# Patient Record
Sex: Female | Born: 1935 | Race: White | Hispanic: No | Smoking: Never smoker
Health system: Southern US, Community
[De-identification: ages and names within clinical notes are randomized; demographics above are authoritative.]

## PROBLEM LIST (undated history)

## (undated) DIAGNOSIS — F0390 Unspecified dementia without behavioral disturbance: Secondary | ICD-10-CM

## (undated) DIAGNOSIS — F32A Depression, unspecified: Secondary | ICD-10-CM

## (undated) DIAGNOSIS — K219 Gastro-esophageal reflux disease without esophagitis: Secondary | ICD-10-CM

## (undated) DIAGNOSIS — F419 Anxiety disorder, unspecified: Secondary | ICD-10-CM

## (undated) DIAGNOSIS — F329 Major depressive disorder, single episode, unspecified: Secondary | ICD-10-CM

## (undated) DIAGNOSIS — I10 Essential (primary) hypertension: Secondary | ICD-10-CM

## (undated) HISTORY — PX: SHOULDER SURGERY: SHX246

---

## 1998-08-23 ENCOUNTER — Emergency Department (HOSPITAL_COMMUNITY): Admission: EM | Admit: 1998-08-23 | Discharge: 1998-08-23 | Payer: Self-pay | Admitting: Emergency Medicine

## 1998-09-30 ENCOUNTER — Emergency Department (HOSPITAL_COMMUNITY): Admission: EM | Admit: 1998-09-30 | Discharge: 1998-09-30 | Payer: Self-pay | Admitting: Emergency Medicine

## 1998-10-17 ENCOUNTER — Emergency Department (HOSPITAL_COMMUNITY): Admission: EM | Admit: 1998-10-17 | Discharge: 1998-10-17 | Payer: Self-pay | Admitting: Emergency Medicine

## 1999-12-31 ENCOUNTER — Emergency Department (HOSPITAL_COMMUNITY): Admission: EM | Admit: 1999-12-31 | Discharge: 1999-12-31 | Payer: Self-pay | Admitting: Emergency Medicine

## 1999-12-31 ENCOUNTER — Encounter: Payer: Self-pay | Admitting: Emergency Medicine

## 2003-09-16 ENCOUNTER — Ambulatory Visit (HOSPITAL_COMMUNITY): Admission: RE | Admit: 2003-09-16 | Discharge: 2003-09-16 | Payer: Self-pay | Admitting: Neurosurgery

## 2007-04-12 ENCOUNTER — Emergency Department (HOSPITAL_COMMUNITY): Admission: EM | Admit: 2007-04-12 | Discharge: 2007-04-12 | Payer: Self-pay | Admitting: Emergency Medicine

## 2008-11-07 ENCOUNTER — Emergency Department (HOSPITAL_COMMUNITY): Admission: EM | Admit: 2008-11-07 | Discharge: 2008-11-07 | Payer: Self-pay | Admitting: Emergency Medicine

## 2009-07-22 ENCOUNTER — Emergency Department (HOSPITAL_COMMUNITY): Admission: EM | Admit: 2009-07-22 | Discharge: 2009-07-22 | Payer: Self-pay | Admitting: Emergency Medicine

## 2009-10-03 ENCOUNTER — Emergency Department (HOSPITAL_COMMUNITY): Admission: EM | Admit: 2009-10-03 | Discharge: 2009-10-03 | Payer: Self-pay | Admitting: Emergency Medicine

## 2009-12-28 ENCOUNTER — Emergency Department (HOSPITAL_COMMUNITY): Admission: EM | Admit: 2009-12-28 | Discharge: 2009-12-28 | Payer: Self-pay | Admitting: Emergency Medicine

## 2010-06-08 ENCOUNTER — Emergency Department (HOSPITAL_COMMUNITY): Admission: EM | Admit: 2010-06-08 | Discharge: 2010-06-08 | Payer: Self-pay | Admitting: Emergency Medicine

## 2010-09-04 ENCOUNTER — Encounter: Payer: Self-pay | Admitting: Neurosurgery

## 2010-10-25 ENCOUNTER — Inpatient Hospital Stay (HOSPITAL_COMMUNITY)
Admission: EM | Admit: 2010-10-25 | Discharge: 2010-10-27 | DRG: 194 | Disposition: A | Discharge status: Home Health | Payer: PRIVATE HEALTH INSURANCE | Attending: Internal Medicine | Admitting: Internal Medicine

## 2010-10-25 ENCOUNTER — Emergency Department (HOSPITAL_COMMUNITY): Payer: PRIVATE HEALTH INSURANCE

## 2010-10-25 DIAGNOSIS — I10 Essential (primary) hypertension: Secondary | ICD-10-CM | POA: Diagnosis present

## 2010-10-25 DIAGNOSIS — E876 Hypokalemia: Secondary | ICD-10-CM | POA: Diagnosis not present

## 2010-10-25 DIAGNOSIS — F3289 Other specified depressive episodes: Secondary | ICD-10-CM | POA: Diagnosis present

## 2010-10-25 DIAGNOSIS — F329 Major depressive disorder, single episode, unspecified: Secondary | ICD-10-CM | POA: Diagnosis present

## 2010-10-25 DIAGNOSIS — J189 Pneumonia, unspecified organism: Principal | ICD-10-CM | POA: Diagnosis present

## 2010-10-25 DIAGNOSIS — E162 Hypoglycemia, unspecified: Secondary | ICD-10-CM | POA: Diagnosis not present

## 2010-10-25 DIAGNOSIS — I498 Other specified cardiac arrhythmias: Secondary | ICD-10-CM | POA: Diagnosis not present

## 2010-10-25 DIAGNOSIS — E870 Hyperosmolality and hypernatremia: Secondary | ICD-10-CM | POA: Diagnosis not present

## 2010-10-25 LAB — CBC
HCT: 37.3 % (ref 36.0–46.0)
Hemoglobin: 12.6 g/dL (ref 12.0–15.0)
MCHC: 33.8 g/dL (ref 30.0–36.0)
MCV: 90.5 fL (ref 78.0–100.0)
RBC: 4.12 MIL/uL (ref 3.87–5.11)
RDW: 12.6 % (ref 11.5–15.5)
WBC: 6.6 10*3/uL (ref 4.0–10.5)

## 2010-10-25 LAB — COMPREHENSIVE METABOLIC PANEL
AST: 27 U/L (ref 0–37)
Albumin: 4.4 g/dL (ref 3.5–5.2)
Alkaline Phosphatase: 80 U/L (ref 39–117)
Chloride: 104 mEq/L (ref 96–112)
GFR calc Af Amer: >60 mL/min (ref >60)
Sodium: 142 mEq/L (ref 135–145)
Total Bilirubin: 0.7 mg/dL (ref 0.3–1.2)
Total Protein: 7.2 g/dL (ref 6.0–8.3)

## 2010-10-25 LAB — URINALYSIS, ROUTINE W REFLEX MICROSCOPIC
Bilirubin Urine: NEGATIVE
Glucose, UA: NEGATIVE mg/dL
Hgb urine dipstick: NEGATIVE
Ketones, ur: 15 mg/dL — AB
Nitrite: NEGATIVE
Urobilinogen, UA: 0.2 mg/dL (ref 0.0–1.0)
pH: 6 (ref 5.0–8.0)

## 2010-10-25 LAB — DIFFERENTIAL
Basophils Absolute: 0 10*3/uL (ref 0.0–0.1)
Basophils Relative: 1 % (ref 0–1)
Neutro Abs: 3.6 10*3/uL (ref 1.7–7.7)

## 2010-10-25 LAB — PROCALCITONIN: Procalcitonin: <0.1 ng/mL

## 2010-10-26 LAB — BASIC METABOLIC PANEL
Calcium: 9.1 mg/dL (ref 8.4–10.5)
Chloride: 111 mEq/L (ref 96–112)
Creatinine, Ser: 1.15 mg/dL (ref 0.4–1.2)
Glucose, Bld: 119 mg/dL — ABNORMAL HIGH (ref 70–99)
Potassium: 3.7 mEq/L (ref 3.5–5.1)
Sodium: 146 mEq/L — ABNORMAL HIGH (ref 135–145)

## 2010-10-26 LAB — DIFFERENTIAL
Eosinophils Absolute: 0.1 10*3/uL (ref 0.0–0.7)
Eosinophils Relative: 1 % (ref 0–5)
Monocytes Absolute: 1 10*3/uL (ref 0.1–1.0)
Monocytes Relative: 10 % (ref 3–12)
Neutro Abs: 8.9 10*3/uL — ABNORMAL HIGH (ref 1.7–7.7)
Neutrophils Relative %: 85 % — ABNORMAL HIGH (ref 43–77)

## 2010-10-26 LAB — CBC
HCT: 37.7 % (ref 36.0–46.0)
Hemoglobin: 12.7 g/dL (ref 12.0–15.0)
MCHC: 33.7 g/dL (ref 30.0–36.0)
RDW: 12.7 % (ref 11.5–15.5)
WBC: 10.5 10*3/uL (ref 4.0–10.5)

## 2010-10-27 LAB — URINE CULTURE
Colony Count: NO GROWTH
Colony Count: >=100000
Culture  Setup Time: 201203130300
Culture  Setup Time: 201110260124
Culture: NO GROWTH

## 2010-10-27 LAB — URINALYSIS, ROUTINE W REFLEX MICROSCOPIC
Bilirubin Urine: NEGATIVE
Protein, ur: NEGATIVE mg/dL
Urobilinogen, UA: 0.2 mg/dL (ref 0.0–1.0)

## 2010-10-27 LAB — BASIC METABOLIC PANEL
CO2: 24 mEq/L (ref 19–32)
Chloride: 112 mEq/L (ref 96–112)
Creatinine, Ser: 1.02 mg/dL (ref 0.4–1.2)
GFR calc Af Amer: >60 mL/min (ref >60)
Potassium: 3.3 mEq/L — ABNORMAL LOW (ref 3.5–5.1)
Sodium: 143 mEq/L (ref 135–145)

## 2010-10-27 LAB — COMPREHENSIVE METABOLIC PANEL
ALT: 18 U/L (ref 0–35)
AST: 24 U/L (ref 0–37)
Alkaline Phosphatase: 70 U/L (ref 39–117)
Calcium: 9.4 mg/dL (ref 8.4–10.5)
Chloride: 106 mEq/L (ref 96–112)
Creatinine, Ser: 1.21 mg/dL — ABNORMAL HIGH (ref 0.4–1.2)
GFR calc Af Amer: 51 mL/min — ABNORMAL LOW (ref >60)
Sodium: 144 mEq/L (ref 135–145)
Total Bilirubin: 0.3 mg/dL (ref 0.3–1.2)

## 2010-10-27 LAB — MAGNESIUM: Magnesium: 1.6 mg/dL (ref 1.5–2.5)

## 2010-10-27 LAB — DIFFERENTIAL
Basophils Absolute: 0.1 10*3/uL (ref 0.0–0.1)
Basophils Relative: 1 % (ref 0–1)
Eosinophils Relative: 2 % (ref 0–5)
Monocytes Absolute: 0.5 10*3/uL (ref 0.1–1.0)
Neutrophils Relative %: 64 % (ref 43–77)

## 2010-10-27 LAB — CBC
Hemoglobin: 12.2 g/dL (ref 12.0–15.0)
Platelets: 266 10*3/uL (ref 150–400)
RBC: 3.91 MIL/uL (ref 3.87–5.11)
RDW: 13.2 % (ref 11.5–15.5)

## 2010-10-27 LAB — GLUCOSE, CAPILLARY: Glucose-Capillary: 58 mg/dL — ABNORMAL LOW (ref 70–99)

## 2010-10-27 LAB — URINE MICROSCOPIC-ADD ON

## 2010-10-30 LAB — CULTURE, BLOOD (ROUTINE X 2): Culture: NO GROWTH

## 2010-11-01 NOTE — Discharge Summary (Signed)
[name], [name]                ACCOUNT NO.:  [id number]  MEDICAL RECORD NO.:  [id number]           PATIENT TYPE:  I  LOCATION:  A317                          FACILITY:  [facility]  PHYSICIAN:  Elliot Cousin, M.D.    DATE OF BIRTH:  1922-05-09  DATE OF ADMISSION:  10/25/2010 DATE OF DISCHARGE:  03/14/2012LH                              DISCHARGE SUMMARY   DISCHARGE DIAGNOSES: 1. Community-acquired pneumonia. 2. Low normal blood pressures in a patient historically with     hypertension. 3. Cough, secondary to pneumonia. 4. Transient hypernatremia, resolved. 5. Transient hypoglycemia, resolved. 6. Hypokalemia. 7. Depression which remained stable. 8. Bradycardia.  TSH and free T4 were pending at the time of hospital     discharge.  DISCHARGE MEDICATIONS: 1. Avelox 400 mg daily for three more days. 2. Tessalon Perles 1 tablet 100 mg 3 times daily as needed for cough. 3. Guaifenesin LA (Mucinex) 600 mg twice daily for three more days. 4. Citalopram 10 mg 1 tablet daily. 5. Pepto-Bismol 1 teaspoon every 6 hours as needed for indigestion. 6. Tramadol 50 mg 1 tablet every 6 hours as needed for pain. 7. Stop Suprax. 8. Stop hydrocodone/homatropine. 9. Stop enalapril/HCTZ.  DISCHARGE DISPOSITION:  The patient was discharged to home in improved and stable condition on October 27, 2010.  She was advised to follow up with her primary care physician, Dr. Joette Catching in 1 week.  We will try to make an appointment for the patient before she leaves today.  CONSULTATIONS:  None.  PROCEDURE PERFORMED:  Chest x-ray on October 25, 2010.  The results revealed lower lobe density seen only on the lateral view which may represent effusion or infiltrate.  HISTORY OF PRESENT ILLNESS:  The patient is an 77 year old woman with a past medical history significant for hypertension, gastroesophageal reflux disease, and status post cholecystectomy.  She presented to the emergency department on October 25, 2010, with a chief complaint of a persistent cough.  The patient had been treated by her primary care physician a week ago for presumed pneumonia with Suprax.  However, during the course of treatment, she continued to have coughing, subjective fevers, and subjective chills.  In the emergency department, she was noted to be afebrile and hemodynamically stable.  Her chest x- ray on the lateral view revealed a lower lobe density which may have represented effusion or infiltrate.  Otherwise, there were findings of COPD with hyperinflation of the lungs.  There was apical scarring noted bilaterally.  Her white blood cell count was within normal limits at 6.6.  She was admitted for further evaluation and management.  HOSPITAL COURSE:  The patient was started empirically on Avelox and vancomycin for presumed failed outpatient treatment for community- acquired pneumonia.  Tessalon Perles were ordered as needed for cough. She was started on Mucinex b.i.d. as well.  Her blood pressures were on the low normal side.  For this reason, Vasotec/HCTZ was discontinued. She was started on gentle IV fluid hydration.  Following the initiation of IV fluids, her serum sodium increased to 146.  The IV fluids were changed to D5 half-normal saline  with potassium chloride added.  When the hypotonic fluids were started, her serum potassium fell from 3.7- 3.3.  She was repleted with potassium chloride orally.  Her blood magnesium level was within normal limits at 1.7.  Surprisingly, her venous glucose fell to 40 on the BMET this morning.  This was surprising as dextrose was added to her IV fluids yesterday.  She has no history of type 2 or type 1 diabetes mellitus.  The hypoglycemia was thought to be a mistake.  Another venous glucose was ordered after a small amount that she had eaten.  The followup blood glucose had improved to 161.  The patient was also noted to be mildly bradycardic.  On admission, her heart  rate was 52 beats per minute.  However, throughout the hospitalization, her heart rate generally ranged from 58-80.  The patient had no complaints of chest pain.  The patient improved symptomatically.  In fact, she had no complaints of coughing at all.  She did remain hemodynamically stable, although her blood pressures were in the 90s to low 100's systolically.  Her serum sodium improved to 143.  She was given 30 mEq of potassium chloride prior to hospital discharge for a serum potassium of 3.3.  She was instructed on Ensure supplements and small snacks to avoid hypoglycemia. Her venous glucose was 161 prior to hospital discharge.  A TSH and free T4 were ordered for evaluation of bradycardia.  The results were pending at the time of hospital discharge.  Blood cultures which had been ordered had remained negative x2 days.  A urine culture which had been ordered as well remained negative.  She received 3 doses of intravenous antibiotics in the hospital.  She was discharged to home on 3 more days of Avelox.     Elliot Cousin, M.D.     DF/MEDQ  D:  10/27/2010  T:  10/27/2010  Job:  [id number]  cc:   Delaney Meigs, M.D. Fax: [phone]  Electronically Signed by Elliot Cousin M.D. on 11/01/2010 09:02:07 AM

## 2010-11-03 LAB — COMPREHENSIVE METABOLIC PANEL
ALT: 17 U/L (ref 0–35)
AST: 21 U/L (ref 0–37)
CO2: 30 mEq/L (ref 19–32)
Calcium: 9 mg/dL (ref 8.4–10.5)
Chloride: 109 mEq/L (ref 96–112)
GFR calc Af Amer: >60 mL/min (ref >60)
GFR calc non Af Amer: 52 mL/min — ABNORMAL LOW (ref >60)
Sodium: 144 mEq/L (ref 135–145)

## 2010-11-03 LAB — URINE CULTURE: Colony Count: 45000

## 2010-11-03 LAB — CBC
MCHC: 33.9 g/dL (ref 30.0–36.0)
RBC: 3.9 MIL/uL (ref 3.87–5.11)
WBC: 4.9 10*3/uL (ref 4.0–10.5)

## 2010-11-03 LAB — DIFFERENTIAL
Eosinophils Absolute: 0.2 10*3/uL (ref 0.0–0.7)
Eosinophils Relative: 5 % (ref 0–5)
Lymphs Abs: 1.4 10*3/uL (ref 0.7–4.0)
Monocytes Absolute: 0.5 10*3/uL (ref 0.1–1.0)
Monocytes Relative: 10 % (ref 3–12)

## 2010-11-03 LAB — URINALYSIS, ROUTINE W REFLEX MICROSCOPIC
Bilirubin Urine: NEGATIVE
Glucose, UA: NEGATIVE mg/dL
Hgb urine dipstick: NEGATIVE
Ketones, ur: NEGATIVE mg/dL
Protein, ur: NEGATIVE mg/dL

## 2010-11-03 LAB — RAPID STREP SCREEN (MED CTR MEBANE ONLY): Streptococcus, Group A Screen (Direct): NEGATIVE
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
## 2010-11-16 LAB — BASIC METABOLIC PANEL
Chloride: 105 mEq/L (ref 96–112)
GFR calc Af Amer: >60 mL/min (ref >60)
GFR calc non Af Amer: 53 mL/min — ABNORMAL LOW (ref >60)
Potassium: 4.3 mEq/L (ref 3.5–5.1)
Sodium: 143 mEq/L (ref 135–145)

## 2010-11-16 LAB — CBC
HCT: 37.9 % (ref 36.0–46.0)
MCV: 91.4 fL (ref 78.0–100.0)
RBC: 4.15 MIL/uL (ref 3.87–5.11)
WBC: 8.3 10*3/uL (ref 4.0–10.5)

## 2010-11-16 LAB — DIFFERENTIAL
Eosinophils Absolute: 0 10*3/uL (ref 0.0–0.7)
Eosinophils Relative: 0 % (ref 0–5)
Lymphocytes Relative: 9 % — ABNORMAL LOW (ref 12–46)
Lymphs Abs: 0.8 10*3/uL (ref 0.7–4.0)
Monocytes Relative: 2 % — ABNORMAL LOW (ref 3–12)

## 2010-11-24 ENCOUNTER — Other Ambulatory Visit (HOSPITAL_COMMUNITY): Payer: Self-pay | Admitting: Family Medicine

## 2010-11-24 ENCOUNTER — Ambulatory Visit (HOSPITAL_COMMUNITY)
Admission: RE | Admit: 2010-11-24 | Discharge: 2010-11-24 | Disposition: A | Discharge status: Home / Self Care | Payer: PRIVATE HEALTH INSURANCE | Source: Ambulatory Visit | Attending: Family Medicine | Admitting: Family Medicine

## 2010-11-24 DIAGNOSIS — J159 Unspecified bacterial pneumonia: Secondary | ICD-10-CM

## 2010-11-24 DIAGNOSIS — R0602 Shortness of breath: Secondary | ICD-10-CM | POA: Insufficient documentation

## 2012-11-28 ENCOUNTER — Emergency Department (HOSPITAL_COMMUNITY)
Admission: EM | Admit: 2012-11-28 | Discharge: 2012-11-28 | Disposition: A | Discharge status: Home / Self Care | Payer: PRIVATE HEALTH INSURANCE | Attending: Emergency Medicine | Admitting: Emergency Medicine

## 2012-11-28 ENCOUNTER — Encounter (HOSPITAL_COMMUNITY): Payer: Self-pay | Admitting: *Deleted

## 2012-11-28 ENCOUNTER — Emergency Department (HOSPITAL_COMMUNITY): Payer: PRIVATE HEALTH INSURANCE

## 2012-11-28 DIAGNOSIS — K219 Gastro-esophageal reflux disease without esophagitis: Secondary | ICD-10-CM | POA: Insufficient documentation

## 2012-11-28 DIAGNOSIS — IMO0002 Reserved for concepts with insufficient information to code with codable children: Secondary | ICD-10-CM | POA: Insufficient documentation

## 2012-11-28 DIAGNOSIS — R05 Cough: Secondary | ICD-10-CM | POA: Insufficient documentation

## 2012-11-28 DIAGNOSIS — Z79899 Other long term (current) drug therapy: Secondary | ICD-10-CM | POA: Insufficient documentation

## 2012-11-28 DIAGNOSIS — I10 Essential (primary) hypertension: Secondary | ICD-10-CM | POA: Insufficient documentation

## 2012-11-28 DIAGNOSIS — R1110 Vomiting, unspecified: Secondary | ICD-10-CM | POA: Insufficient documentation

## 2012-11-28 DIAGNOSIS — F3289 Other specified depressive episodes: Secondary | ICD-10-CM | POA: Insufficient documentation

## 2012-11-28 DIAGNOSIS — R55 Syncope and collapse: Secondary | ICD-10-CM | POA: Insufficient documentation

## 2012-11-28 DIAGNOSIS — F329 Major depressive disorder, single episode, unspecified: Secondary | ICD-10-CM | POA: Insufficient documentation

## 2012-11-28 DIAGNOSIS — R197 Diarrhea, unspecified: Secondary | ICD-10-CM

## 2012-11-28 DIAGNOSIS — F411 Generalized anxiety disorder: Secondary | ICD-10-CM | POA: Insufficient documentation

## 2012-11-28 DIAGNOSIS — R109 Unspecified abdominal pain: Secondary | ICD-10-CM | POA: Insufficient documentation

## 2012-11-28 DIAGNOSIS — R059 Cough, unspecified: Secondary | ICD-10-CM | POA: Insufficient documentation

## 2012-11-28 DIAGNOSIS — R5381 Other malaise: Secondary | ICD-10-CM | POA: Insufficient documentation

## 2012-11-28 DIAGNOSIS — F0390 Unspecified dementia without behavioral disturbance: Secondary | ICD-10-CM | POA: Insufficient documentation

## 2012-11-28 DIAGNOSIS — R531 Weakness: Secondary | ICD-10-CM

## 2012-11-28 HISTORY — DX: Anxiety disorder, unspecified: F41.9

## 2012-11-28 HISTORY — DX: Major depressive disorder, single episode, unspecified: F32.9

## 2012-11-28 HISTORY — DX: Depression, unspecified: F32.A

## 2012-11-28 HISTORY — DX: Essential (primary) hypertension: I10

## 2012-11-28 HISTORY — DX: Gastro-esophageal reflux disease without esophagitis: K21.9

## 2012-11-28 HISTORY — DX: Unspecified dementia, unspecified severity, without behavioral disturbance, psychotic disturbance, mood disturbance, and anxiety: F03.90

## 2012-11-28 LAB — COMPREHENSIVE METABOLIC PANEL
BUN: 21 mg/dL (ref 6–23)
Calcium: 9.9 mg/dL (ref 8.4–10.5)
Creatinine, Ser: 1.03 mg/dL (ref 0.50–1.10)
GFR calc Af Amer: 55 mL/min — ABNORMAL LOW (ref >90)
GFR calc non Af Amer: 47 mL/min — ABNORMAL LOW (ref >90)
Glucose, Bld: 117 mg/dL — ABNORMAL HIGH (ref 70–99)
Sodium: 140 mEq/L (ref 135–145)
Total Protein: 7.6 g/dL (ref 6.0–8.3)

## 2012-11-28 LAB — CBC
HCT: 42.1 % (ref 36.0–46.0)
Hemoglobin: 14.2 g/dL (ref 12.0–15.0)
MCHC: 33.7 g/dL (ref 30.0–36.0)
MCV: 91.7 fL (ref 78.0–100.0)
RDW: 12.9 % (ref 11.5–15.5)
WBC: 12.5 10*3/uL — ABNORMAL HIGH (ref 4.0–10.5)

## 2012-11-28 LAB — TROPONIN I: Troponin I: <0.3 ng/mL (ref <0.30)

## 2012-11-28 MED ORDER — ONDANSETRON HCL 4 MG/2ML IJ SOLN
4.0000 mg | Freq: Once | INTRAMUSCULAR | Status: AC
  Administered 2012-11-28: 4 mg via INTRAVENOUS
  Filled 2012-11-28: qty 2

## 2012-11-28 MED ORDER — SODIUM CHLORIDE 0.9 % IV BOLUS (SEPSIS)
1000.0000 mL | Freq: Once | INTRAVENOUS | Status: AC
  Administered 2012-11-28: 1000 mL via INTRAVENOUS

## 2012-11-28 MED ORDER — SODIUM CHLORIDE 0.9 % IV SOLN: INTRAVENOUS | Status: DC

## 2012-11-28 NOTE — ED Provider Notes (Signed)
History     CSN: [id number]  Arrival date & time 11/28/12  0000   First MD Initiated Contact with Patient 11/28/12 0022      Chief Complaint  Patient presents with  . Diarrhea  . Emesis  . Near Syncope    (Consider location/radiation/quality/duration/timing/severity/associated sxs/prior treatment) HPI Hx per PT and family bedside. Feeling well today and this evening developed brief period of lower abdominal cramping followed by diarrhea with generalized weakness. At that time patient felt near syncopal without LOC. No fall. No nausea or vomiting but has had recent cough and bronchitis - she is taken last of her antibiotics tonight. No fevers. No blood in stools. No hemoptysis. No chest pain or shortness of breath. Symptoms moderate in severity and now feels better. No known aggravating or alleviating factors.  Past Medical History  Diagnosis Date  . Hypertension   . Anxiety   . Depression   . Dementia   . GERD (gastroesophageal reflux disease)     History reviewed. No pertinent past surgical history.  History reviewed. No pertinent family history.  History  Substance Use Topics  . Smoking status: Never Smoker   . Smokeless tobacco: Not on file  . Alcohol Use: No    OB History   Grav Para Term Preterm Abortions TAB SAB Ect Mult Living                  Review of Systems  Constitutional: Negative for fever and chills.  HENT: Negative for neck pain and neck stiffness.   Eyes: Negative for pain.  Respiratory: Positive for cough. Negative for shortness of breath.   Cardiovascular: Negative for chest pain.  Gastrointestinal: Positive for diarrhea. Negative for blood in stool and abdominal distention.  Genitourinary: Negative for dysuria.  Musculoskeletal: Negative for back pain.  Skin: Negative for rash.  Neurological: Negative for headaches.  All other systems reviewed and are negative.    Allergies  Tylenol  Home Medications   Current Outpatient Rx  Name   Route  Sig  Dispense  Refill  . citalopram (CELEXA) 10 MG tablet   Oral   Take 10 mg by mouth daily.         . enalapril (VASOTEC) 5 MG tablet   Oral   Take 5 mg by mouth daily.         . fluticasone (VERAMYST) 27.5 MCG/SPRAY nasal spray   Nasal   Place 2 sprays into the nose daily.         Marland Kitchen loratadine (CLARITIN) 10 MG tablet   Oral   Take 10 mg by mouth daily.         Marland Kitchen LORazepam (ATIVAN) 1 MG tablet   Oral   Take 1 mg by mouth every 8 (eight) hours.         . pantoprazole (PROTONIX) 40 MG tablet   Oral   Take 40 mg by mouth daily.         . traMADol (ULTRAM) 50 MG tablet   Oral   Take 50 mg by mouth every 6 (six) hours as needed for pain.           BP 123/56  Pulse 62  Temp(Src) 98.4 F (36.9 C) (Oral)  Resp 18  Ht 5\' 5"  (1.651 m)  Wt 114 lb (51.71 kg)  BMI 18.97 kg/m2  SpO2 100%  Physical Exam  Constitutional: She is oriented to person, place, and time. She appears well-developed and well-nourished.  HENT:  Head:  Normocephalic and atraumatic.  Mildly dry mucous membranes  Eyes: EOM are normal. Pupils are equal, round, and reactive to light. No scleral icterus.  Neck: Neck supple.  Cardiovascular: Normal rate, regular rhythm and intact distal pulses.   Pulmonary/Chest: Effort normal and breath sounds normal. No respiratory distress. She has no wheezes. She exhibits no tenderness.  Abdominal: Soft. Bowel sounds are normal. She exhibits no distension. There is no tenderness. There is no rebound and no guarding.  Musculoskeletal: Normal range of motion. She exhibits no edema.  Neurological: She is alert and oriented to person, place, and time.  Skin: Skin is warm and dry. No rash noted.    ED Course  Procedures (including critical care time) Results for orders placed during the hospital encounter of 11/28/12  LACTIC ACID, PLASMA      Result Value Range   Lactic Acid, Venous 3.1 (*) 0.5 - 2.2 mmol/L  CBC      Result Value Range   WBC 12.5  (*) 4.0 - 10.5 K/uL   RBC 4.59  3.87 - 5.11 MIL/uL   Hemoglobin 14.2  12.0 - 15.0 g/dL   HCT 45.4  09.8 - 11.9 %   MCV 91.7  78.0 - 100.0 fL   MCH 30.9  26.0 - 34.0 pg   MCHC 33.7  30.0 - 36.0 g/dL   RDW 14.7  82.9 - 56.2 %   Platelets 237  150 - 400 K/uL  COMPREHENSIVE METABOLIC PANEL      Result Value Range   Sodium 140  135 - 145 mEq/L   Potassium 4.5  3.5 - 5.1 mEq/L   Chloride 105  96 - 112 mEq/L   CO2 22  19 - 32 mEq/L   Glucose, Bld 117 (*) 70 - 99 mg/dL   BUN 21  6 - 23 mg/dL   Creatinine, Ser 1.30  0.50 - 1.10 mg/dL   Calcium 9.9  8.4 - 86.5 mg/dL   Total Protein 7.6  6.0 - 8.3 g/dL   Albumin 4.1  3.5 - 5.2 g/dL   AST 23  0 - 37 U/L   ALT 17  0 - 35 U/L   Alkaline Phosphatase 103  39 - 117 U/L   Total Bilirubin 0.3  0.3 - 1.2 mg/dL   GFR calc non Af Amer 47 (*) >90 mL/min   GFR calc Af Amer 55 (*) >90 mL/min  TROPONIN I      Result Value Range   Troponin I <0.30  <0.30 ng/mL   Dg Abd Acute W/chest  11/28/2012  *RADIOLOGY REPORT*  Clinical Data: Abdominal pain with nausea, vomiting, diarrhea.  ACUTE ABDOMEN SERIES (ABDOMEN 2 VIEW & CHEST 1 VIEW)  Comparison: Chest x-ray dated 11/24/2010 and acute abdominal series dated 11/07/2008  Findings: No acute abnormalities of the heart or lungs. Slight thoracolumbar scoliosis.  No free air in the abdomen.  No dilated loops of large or small bowel.  No worrisome abdominal calcifications.  IMPRESSION: No acute abnormality of the abdomen or chest.   Original Report Authenticated By: Francene Boyers, M.D.       Date: 11/28/2012  Rate: 63  Rhythm: normal sinus rhythm  QRS Axis: normal  Intervals: normal  ST/T Wave abnormalities: nonspecific ST changes  Conduction Disutrbances:none  Narrative Interpretation:   Old EKG Reviewed: none available  IV fluids. Zofran  2:30 AM recheck - patient feels much better and is requesting to be discharged home. Lactic acid is mildly elevated. Orthostatics checked and are within  normal limits.  She ambulates no acute distress. No ABD pain or cramping in the emergency department. Repeat abdominal exam remains benign. Plan discharge home with dehydration precautions and followup primary care physician for recheck in the clinic. Patient and her family agreed to strict return precautions for abdominal pain, near-syncope or any concerning symptoms  MDM  Diarrhea and dehydration presenting with generalized weakness, improved with IV fluids. Recent bronchitis with those symptoms resolved  EKG. Imaging. Labs.   Vital signs and nursing notes reviewed. Serial examinations. Stable for discharge home and outpatient follow        Sunnie Nielsen, MD 11/28/12 [phone]

## 2012-11-28 NOTE — ED Notes (Signed)
Pt had 1 episode of diarrhea, emesis, and now co weakness, had near syncopal episode at home.

## 2013-07-13 ENCOUNTER — Emergency Department (HOSPITAL_COMMUNITY): Payer: PRIVATE HEALTH INSURANCE

## 2013-07-13 ENCOUNTER — Emergency Department (HOSPITAL_COMMUNITY)
Admission: EM | Admit: 2013-07-13 | Discharge: 2013-07-13 | Disposition: A | Discharge status: Home / Self Care | Payer: PRIVATE HEALTH INSURANCE | Attending: Emergency Medicine | Admitting: Emergency Medicine

## 2013-07-13 ENCOUNTER — Encounter (HOSPITAL_COMMUNITY): Payer: Self-pay | Admitting: Emergency Medicine

## 2013-07-13 DIAGNOSIS — S43016A Anterior dislocation of unspecified humerus, initial encounter: Secondary | ICD-10-CM | POA: Insufficient documentation

## 2013-07-13 DIAGNOSIS — F411 Generalized anxiety disorder: Secondary | ICD-10-CM | POA: Insufficient documentation

## 2013-07-13 DIAGNOSIS — I10 Essential (primary) hypertension: Secondary | ICD-10-CM | POA: Insufficient documentation

## 2013-07-13 DIAGNOSIS — K219 Gastro-esophageal reflux disease without esophagitis: Secondary | ICD-10-CM | POA: Insufficient documentation

## 2013-07-13 DIAGNOSIS — F329 Major depressive disorder, single episode, unspecified: Secondary | ICD-10-CM | POA: Insufficient documentation

## 2013-07-13 DIAGNOSIS — S0003XA Contusion of scalp, initial encounter: Secondary | ICD-10-CM | POA: Insufficient documentation

## 2013-07-13 DIAGNOSIS — Z79899 Other long term (current) drug therapy: Secondary | ICD-10-CM | POA: Insufficient documentation

## 2013-07-13 DIAGNOSIS — F0390 Unspecified dementia without behavioral disturbance: Secondary | ICD-10-CM | POA: Insufficient documentation

## 2013-07-13 DIAGNOSIS — IMO0002 Reserved for concepts with insufficient information to code with codable children: Secondary | ICD-10-CM | POA: Insufficient documentation

## 2013-07-13 DIAGNOSIS — W19XXXA Unspecified fall, initial encounter: Secondary | ICD-10-CM

## 2013-07-13 DIAGNOSIS — Y939 Activity, unspecified: Secondary | ICD-10-CM | POA: Insufficient documentation

## 2013-07-13 DIAGNOSIS — Y929 Unspecified place or not applicable: Secondary | ICD-10-CM | POA: Insufficient documentation

## 2013-07-13 DIAGNOSIS — S43004A Unspecified dislocation of right shoulder joint, initial encounter: Secondary | ICD-10-CM

## 2013-07-13 DIAGNOSIS — S6990XA Unspecified injury of unspecified wrist, hand and finger(s), initial encounter: Secondary | ICD-10-CM | POA: Insufficient documentation

## 2013-07-13 DIAGNOSIS — W010XXA Fall on same level from slipping, tripping and stumbling without subsequent striking against object, initial encounter: Secondary | ICD-10-CM | POA: Insufficient documentation

## 2013-07-13 DIAGNOSIS — S59909A Unspecified injury of unspecified elbow, initial encounter: Secondary | ICD-10-CM | POA: Insufficient documentation

## 2013-07-13 DIAGNOSIS — F3289 Other specified depressive episodes: Secondary | ICD-10-CM | POA: Insufficient documentation

## 2013-07-13 MED ORDER — TRAMADOL HCL 50 MG PO TABS: 50.0000 mg | ORAL_TABLET | Freq: Four times a day (QID) | ORAL | Status: DC | PRN

## 2013-07-13 MED ORDER — TRAMADOL HCL 50 MG PO TABS
ORAL_TABLET | ORAL | Status: AC
  Filled 2013-07-13: qty 1

## 2013-07-13 MED ORDER — PROPOFOL 10 MG/ML IV BOLUS
10.0000 mg | Freq: Once | INTRAVENOUS | Status: AC
  Administered 2013-07-13: 15 mg via INTRAVENOUS
  Filled 2013-07-13: qty 1

## 2013-07-13 MED ORDER — TRAMADOL HCL 50 MG PO TABS
50.0000 mg | ORAL_TABLET | Freq: Once | ORAL | Status: AC
  Administered 2013-07-13: 50 mg via ORAL

## 2013-07-13 NOTE — ED Notes (Signed)
Per pt's daughter, "She was trying to get up and see what was in a ups box and she tripped and fell. I put heat and ice on it and she just started crying, so I knew that something was wrong. She just screams when I lifted it up to put on her clothes."

## 2013-07-13 NOTE — ED Notes (Signed)
Occasional PAC's and PVC's SR on cardiac monitor

## 2013-07-13 NOTE — ED Notes (Signed)
Vital signs stable. 

## 2013-07-13 NOTE — ED Notes (Signed)
Family updated as to patient's status.  Daughter at bedside.  

## 2013-07-13 NOTE — ED Notes (Signed)
Shoulder manipulated into place by dr pickering.

## 2013-07-13 NOTE — ED Notes (Signed)
MD at bedside. 

## 2013-07-13 NOTE — ED Notes (Signed)
Pt tripped and fell this morning, family stated that the pt has been c/o right arm pain since the fall, fall was witnessed by her family, no loc.  Pt with h/o dementia.

## 2013-07-13 NOTE — ED Notes (Signed)
Radiology at bedside to complete post reduction xray

## 2013-07-13 NOTE — ED Provider Notes (Signed)
CSN: [id number]     Arrival date & time 07/13/13  1716 History  This chart was scribed for [employer]. Rubin Payor, MD by Joaquin Music, ED Scribe. This patient was seen in room APA01/APA01 and the patient's care was started at 6:14 PM  Chief Complaint  Patient presents with  . Fall   The history is provided by a caregiver. The history is limited by the condition of the patient (Pt has dementia). No language interpreter was used.  LEVEL 5 CAVEAT-DEMENTIA  HPI Comments: [name] is a 79 y.o. female with a hx of dementia who presents to the Emergency Department complaining of fall this morning. Family member states pt has been complaining of R arm pain. Family member has noted minor scratches to pt R arm and lower extremities. Pt has been complaining of neck pain. Family member denies pt LOC. Family member states pt generally does not have a good sense of balance prior to her fall. Family member denies any other injuries.   Past Medical History  Diagnosis Date  . Hypertension   . Anxiety   . Depression   . Dementia   . GERD (gastroesophageal reflux disease)    History reviewed. No pertinent past surgical history. No family history on file. History  Substance Use Topics  . Smoking status: Never Smoker   . Smokeless tobacco: Not on file  . Alcohol Use: No   OB History   Grav Para Term Preterm Abortions TAB SAB Ect Mult Living                 Review of Systems  Unable to perform ROS: Dementia   Allergies  Tylenol  Home Medications   Current Outpatient Rx  Name  Route  Sig  Dispense  Refill  . cholecalciferol (VITAMIN D) 400 UNITS TABS tablet   Oral   Take 400 Units by mouth daily.         . citalopram (CELEXA) 10 MG tablet   Oral   Take 10 mg by mouth daily.         . enalapril (VASOTEC) 5 MG tablet   Oral   Take 5 mg by mouth daily.         . fluticasone (VERAMYST) 27.5 MCG/SPRAY nasal spray   Nasal   Place 2 sprays into the nose daily.          Marland Kitchen loratadine (CLARITIN) 10 MG tablet   Oral   Take 10 mg by mouth daily.         Marland Kitchen LORazepam (ATIVAN) 1 MG tablet   Oral   Take 1 mg by mouth 2 (two) times daily as needed for anxiety.          . pantoprazole (PROTONIX) 40 MG tablet   Oral   Take 40 mg by mouth daily.         . traMADol (ULTRAM) 50 MG tablet   Oral   Take 50 mg by mouth every 6 (six) hours as needed for pain.         . traMADol (ULTRAM) 50 MG tablet   Oral   Take 1 tablet (50 mg total) by mouth every 6 (six) hours as needed.   15 tablet   0    Triage Vitals:BP 174/84  Pulse 77  Temp(Src) 98.2 F (36.8 C) (Oral)  Resp 18  Ht 5\' 5"  (1.651 m)  Wt 105 lb (47.628 kg)  BMI 17.47 kg/m2  SpO2 96%  Physical Exam  HENT:  Ecchymotic area on Left lower jaw  Pulmonary/Chest:  Chest non-tender. L chest fullness  Musculoskeletal:  L arm atraumatic. Pain with movement of R elbow. Neurovascularly intact.   ED Course  ORTHOPEDIC INJURY TREATMENT Date/Time: 07/13/2013 9:44 PM Performed by: Benjiman Core R. Authorized by: Billee Cashing Consent: Verbal consent obtained. written consent not obtained. Risks and benefits: risks, benefits and alternatives were discussed Consent given by: patient Patient understanding: patient states understanding of the procedure being performed Patient consent: the patient's understanding of the procedure matches consent given Procedure consent: procedure consent matches procedure scheduled Relevant documents: relevant documents present and verified Test results: test results available and properly labeled Site marked: the operative site was marked Imaging studies: imaging studies available Required items: required blood products, implants, devices, and special equipment available Patient identity confirmed: verbally with patient, arm band and provided demographic data Time out: Immediately prior to procedure a "time out" was called to verify the correct  patient, procedure, equipment, support staff and site/side marked as required. Injury location: shoulder Location details: right shoulder Injury type: dislocation Dislocation type: anterior Hill-Sachs deformity: no Chronicity: new Pre-procedure neurovascular assessment: neurovascularly intact Pre-procedure distal perfusion: normal Pre-procedure neurological function: normal Pre-procedure range of motion: reduced Local anesthesia used: no Patient sedated: yes Sedation type: moderate (conscious) sedation Sedatives: propofol Vitals: Vital signs were monitored during sedation. (10 minutes of sedation) Manipulation performed: yes Reduction method: traction and counter traction Reduction successful: yes X-ray confirmed reduction: yes Immobilization: sling Post-procedure neurovascular assessment: post-procedure neurovascularly intact Post-procedure distal perfusion: normal Post-procedure neurological function: normal Post-procedure range of motion: improved Patient tolerance: Patient tolerated the procedure well with no immediate complications.    COORDINATION OF CARE: 6:21 PM-Discussed treatment plan which includes X-Rays of area. Caregiver of pt agreed to plan.   8:10 PM-Checked on pts status. Will perform R arm reduction.  8:26 PM-Performed R arm reduction. Will D/C pt in a R arm splint.  Labs Review Labs Reviewed - No data to display Imaging Review Dg Shoulder Right  07/13/2013   CLINICAL DATA:  Reduction  EXAM: RIGHT SHOULDER - 2+ VIEW  COMPARISON:  1840 hr  FINDINGS: There is now anatomic alignment of the humeral head with respect to the glenoid. Small bony densities remain adjacent to the posterior humeral head. Small avulsion fractures are not excluded.  IMPRESSION: Anatomic reduction. Tiny fracture fragments adjacent to the posterior humeral head are noted.   Electronically Signed   By: Maryclare Bean M.D.   On: 07/13/2013 21:00   Dg Shoulder Right  07/13/2013   CLINICAL  DATA:  Fall  EXAM: RIGHT SHOULDER - 2+ VIEW  COMPARISON:  None.  FINDINGS: There is complete anterior dislocation of the humeral head with respect to the glenoid. Small bony densities at the inferior glenoid likely represent osteophytes. Small fracture fragments cannot be excluded.  IMPRESSION: Anterior glenohumeral dislocation. Possible fracture fragments adjacent to the gland   Electronically Signed   By: Maryclare Bean M.D.   On: 07/13/2013 19:19   Dg Elbow 2 Views Right  07/13/2013   CLINICAL DATA:  Fall  EXAM: RIGHT ELBOW - 2 VIEW  COMPARISON:  None.  FINDINGS: The lateral view is limited. No obvious fracture or dislocation. No obvious joint effusion.  IMPRESSION: No acute bony pathology.   Electronically Signed   By: Maryclare Bean M.D.   On: 07/13/2013 19:20   Ct Head Wo Contrast  07/13/2013   CLINICAL DATA:  Fall  EXAM: CT HEAD WITHOUT CONTRAST  CT MAXILLOFACIAL WITHOUT CONTRAST  CT CERVICAL SPINE WITHOUT CONTRAST  TECHNIQUE: Multidetector CT imaging of the head, cervical spine, and maxillofacial structures were performed using the standard protocol without intravenous contrast. Multiplanar CT image reconstructions of the cervical spine and maxillofacial structures were also generated.  COMPARISON:  12/28/2009  FINDINGS: CT HEAD FINDINGS  Chronic ischemic changes in the periventricular white matter. Global atrophy. No mass effect, midline shift, or acute intracranial hemorrhage. Mastoid air cells and visualized paranasal sinuses are clear. Cranium is intact.  CT MAXILLOFACIAL FINDINGS  Postoperative changes in the right maxillary sinus and right maxilla are stable. No breakage or loosening of the hardware. No acute fracture or dislocation. Periodontal disease involving a right lower bicuspid is associated with a lytic lesion in the mandible. Upper alveolar rate she is edentulous. No mandible fracture. Globes are intact.  CT CERVICAL SPINE FINDINGS  No acute fracture. No dislocation. Advanced spondylitic and  degenerative changes throughout the cervical spine are noted. Fibrotic changes at the lung apices have a chronic appearance. An element of spinal stenosis is suspected at C6-7 and C3-4. Foraminal narrowing occurs at C5-6 and C6-7 on the left secondary to uncovertebral osteophytes. No obvious soft tissue injury. No spinal hematoma. Thyroid is atrophic.  IMPRESSION: No acute intracranial pathology.  No acute facial bone injury.  No evidence of cervical spine injury.   Electronically Signed   By: Maryclare Bean M.D.   On: 07/13/2013 19:18   Ct Cervical Spine Wo Contrast  07/13/2013   CLINICAL DATA:  Fall  EXAM: CT HEAD WITHOUT CONTRAST  CT MAXILLOFACIAL WITHOUT CONTRAST  CT CERVICAL SPINE WITHOUT CONTRAST  TECHNIQUE: Multidetector CT imaging of the head, cervical spine, and maxillofacial structures were performed using the standard protocol without intravenous contrast. Multiplanar CT image reconstructions of the cervical spine and maxillofacial structures were also generated.  COMPARISON:  12/28/2009  FINDINGS: CT HEAD FINDINGS  Chronic ischemic changes in the periventricular white matter. Global atrophy. No mass effect, midline shift, or acute intracranial hemorrhage. Mastoid air cells and visualized paranasal sinuses are clear. Cranium is intact.  CT MAXILLOFACIAL FINDINGS  Postoperative changes in the right maxillary sinus and right maxilla are stable. No breakage or loosening of the hardware. No acute fracture or dislocation. Periodontal disease involving a right lower bicuspid is associated with a lytic lesion in the mandible. Upper alveolar rate she is edentulous. No mandible fracture. Globes are intact.  CT CERVICAL SPINE FINDINGS  No acute fracture. No dislocation. Advanced spondylitic and degenerative changes throughout the cervical spine are noted. Fibrotic changes at the lung apices have a chronic appearance. An element of spinal stenosis is suspected at C6-7 and C3-4. Foraminal narrowing occurs at C5-6 and  C6-7 on the left secondary to uncovertebral osteophytes. No obvious soft tissue injury. No spinal hematoma. Thyroid is atrophic.  IMPRESSION: No acute intracranial pathology.  No acute facial bone injury.  No evidence of cervical spine injury.   Electronically Signed   By: Maryclare Bean M.D.   On: 07/13/2013 19:18   Ct Maxillofacial Wo Cm  07/13/2013   CLINICAL DATA:  Fall  EXAM: CT HEAD WITHOUT CONTRAST  CT MAXILLOFACIAL WITHOUT CONTRAST  CT CERVICAL SPINE WITHOUT CONTRAST  TECHNIQUE: Multidetector CT imaging of the head, cervical spine, and maxillofacial structures were performed using the standard protocol without intravenous contrast. Multiplanar CT image reconstructions of the cervical spine and maxillofacial structures were also generated.  COMPARISON:  12/28/2009  FINDINGS: CT HEAD FINDINGS  Chronic ischemic changes in the  periventricular white matter. Global atrophy. No mass effect, midline shift, or acute intracranial hemorrhage. Mastoid air cells and visualized paranasal sinuses are clear. Cranium is intact.  CT MAXILLOFACIAL FINDINGS  Postoperative changes in the right maxillary sinus and right maxilla are stable. No breakage or loosening of the hardware. No acute fracture or dislocation. Periodontal disease involving a right lower bicuspid is associated with a lytic lesion in the mandible. Upper alveolar rate she is edentulous. No mandible fracture. Globes are intact.  CT CERVICAL SPINE FINDINGS  No acute fracture. No dislocation. Advanced spondylitic and degenerative changes throughout the cervical spine are noted. Fibrotic changes at the lung apices have a chronic appearance. An element of spinal stenosis is suspected at C6-7 and C3-4. Foraminal narrowing occurs at C5-6 and C6-7 on the left secondary to uncovertebral osteophytes. No obvious soft tissue injury. No spinal hematoma. Thyroid is atrophic.  IMPRESSION: No acute intracranial pathology.  No acute facial bone injury.  No evidence of cervical  spine injury.   Electronically Signed   By: Maryclare Bean M.D.   On: 07/13/2013 19:18    EKG Interpretation   None       MDM   1. Fall, initial encounter   2. Shoulder dislocation, right, initial encounter    Patient with fall. Did hit chin. Negative head, maxillofacial, and cervical spine CT. Right shoulder dislocation. Does have small fracture adjacent to glenoid. Reduced under conscious sedation. Will followup with orthopedic surgery. Family requested Dr. Hilda Lias  I personally performed the services described in this documentation, which was scribed in my presence. The recorded information has been reviewed and is accurate.     Juliet Rude. Rubin Payor, MD 07/13/13 [phone]

## 2013-07-13 NOTE — ED Notes (Signed)
Pt remains alert. 5mg  Propofol IVP

## 2013-07-13 NOTE — ED Notes (Signed)
Pt's mentation remains at baseline. Pt states "I'm comfortable." Daughter at bedside

## 2013-07-13 NOTE — ED Notes (Signed)
Cardiac monitor showing NSR

## 2013-07-13 NOTE — ED Notes (Signed)
Pt to baseline. Oriented to self. No complaints of pain, face relaxed

## 2013-07-13 NOTE — ED Notes (Signed)
Pt remains alert, groans with movement of right arm. 5mg  propofol IVP.

## 2013-09-16 ENCOUNTER — Ambulatory Visit: Payer: PRIVATE HEALTH INSURANCE | Attending: Orthopaedic Surgery | Admitting: Physical Therapy

## 2013-09-16 DIAGNOSIS — M545 Low back pain, unspecified: Secondary | ICD-10-CM | POA: Insufficient documentation

## 2013-09-16 DIAGNOSIS — IMO0001 Reserved for inherently not codable concepts without codable children: Secondary | ICD-10-CM | POA: Insufficient documentation

## 2013-09-16 DIAGNOSIS — M6281 Muscle weakness (generalized): Secondary | ICD-10-CM | POA: Diagnosis not present

## 2013-09-18 ENCOUNTER — Ambulatory Visit: Payer: PRIVATE HEALTH INSURANCE | Admitting: Physical Therapy

## 2013-09-18 DIAGNOSIS — IMO0001 Reserved for inherently not codable concepts without codable children: Secondary | ICD-10-CM | POA: Diagnosis not present

## 2013-09-24 ENCOUNTER — Ambulatory Visit: Payer: PRIVATE HEALTH INSURANCE | Admitting: Physical Therapy

## 2013-09-24 DIAGNOSIS — IMO0001 Reserved for inherently not codable concepts without codable children: Secondary | ICD-10-CM | POA: Diagnosis not present

## 2013-09-26 ENCOUNTER — Ambulatory Visit: Payer: PRIVATE HEALTH INSURANCE | Admitting: Physical Therapy

## 2013-09-26 DIAGNOSIS — IMO0001 Reserved for inherently not codable concepts without codable children: Secondary | ICD-10-CM | POA: Diagnosis not present

## 2013-10-01 ENCOUNTER — Encounter: Payer: PRIVATE HEALTH INSURANCE | Admitting: Physical Therapy

## 2013-10-03 ENCOUNTER — Ambulatory Visit: Payer: PRIVATE HEALTH INSURANCE | Admitting: Physical Therapy

## 2013-10-03 DIAGNOSIS — IMO0001 Reserved for inherently not codable concepts without codable children: Secondary | ICD-10-CM | POA: Diagnosis not present

## 2013-10-08 ENCOUNTER — Encounter: Payer: PRIVATE HEALTH INSURANCE | Admitting: Physical Therapy

## 2013-10-10 ENCOUNTER — Encounter: Payer: PRIVATE HEALTH INSURANCE | Admitting: Physical Therapy

## 2013-10-15 ENCOUNTER — Ambulatory Visit: Payer: PRIVATE HEALTH INSURANCE | Attending: Orthopaedic Surgery | Admitting: Physical Therapy

## 2013-10-15 DIAGNOSIS — M6281 Muscle weakness (generalized): Secondary | ICD-10-CM | POA: Diagnosis not present

## 2013-10-15 DIAGNOSIS — M545 Low back pain, unspecified: Secondary | ICD-10-CM | POA: Insufficient documentation

## 2013-10-15 DIAGNOSIS — IMO0001 Reserved for inherently not codable concepts without codable children: Secondary | ICD-10-CM | POA: Diagnosis present

## 2013-10-17 ENCOUNTER — Encounter: Payer: PRIVATE HEALTH INSURANCE | Admitting: Physical Therapy

## 2013-10-17 ENCOUNTER — Encounter: Payer: PRIVATE HEALTH INSURANCE | Admitting: *Deleted

## 2013-10-22 ENCOUNTER — Ambulatory Visit: Payer: PRIVATE HEALTH INSURANCE | Admitting: Physical Therapy

## 2013-10-22 DIAGNOSIS — M545 Low back pain, unspecified: Secondary | ICD-10-CM | POA: Diagnosis not present

## 2013-10-24 ENCOUNTER — Ambulatory Visit: Payer: PRIVATE HEALTH INSURANCE | Admitting: Physical Therapy

## 2013-10-24 DIAGNOSIS — M545 Low back pain, unspecified: Secondary | ICD-10-CM | POA: Diagnosis not present

## 2013-10-29 ENCOUNTER — Ambulatory Visit: Payer: PRIVATE HEALTH INSURANCE | Admitting: *Deleted

## 2013-10-29 DIAGNOSIS — M545 Low back pain, unspecified: Secondary | ICD-10-CM | POA: Diagnosis not present

## 2013-10-30 ENCOUNTER — Ambulatory Visit: Payer: PRIVATE HEALTH INSURANCE | Admitting: Physical Therapy

## 2013-10-30 DIAGNOSIS — M545 Low back pain, unspecified: Secondary | ICD-10-CM | POA: Diagnosis not present

## 2013-11-04 ENCOUNTER — Ambulatory Visit: Payer: PRIVATE HEALTH INSURANCE | Admitting: Physical Therapy

## 2013-11-04 DIAGNOSIS — M545 Low back pain, unspecified: Secondary | ICD-10-CM | POA: Diagnosis not present

## 2013-11-07 ENCOUNTER — Encounter: Payer: PRIVATE HEALTH INSURANCE | Admitting: Physical Therapy

## 2013-11-26 ENCOUNTER — Encounter: Payer: PRIVATE HEALTH INSURANCE | Admitting: Physical Therapy

## 2013-11-28 ENCOUNTER — Ambulatory Visit: Payer: PRIVATE HEALTH INSURANCE | Attending: Orthopaedic Surgery | Admitting: Physical Therapy

## 2013-11-28 DIAGNOSIS — M545 Low back pain, unspecified: Secondary | ICD-10-CM | POA: Insufficient documentation

## 2013-11-28 DIAGNOSIS — M6281 Muscle weakness (generalized): Secondary | ICD-10-CM | POA: Insufficient documentation

## 2014-12-11 ENCOUNTER — Emergency Department (HOSPITAL_COMMUNITY)
Admission: EM | Admit: 2014-12-11 | Discharge: 2014-12-11 | Disposition: A | Discharge status: Home / Self Care | Payer: Medicare Other | Attending: Emergency Medicine | Admitting: Emergency Medicine

## 2014-12-11 ENCOUNTER — Encounter (HOSPITAL_COMMUNITY): Payer: Self-pay

## 2014-12-11 ENCOUNTER — Emergency Department (HOSPITAL_COMMUNITY): Payer: Medicare Other

## 2014-12-11 DIAGNOSIS — F419 Anxiety disorder, unspecified: Secondary | ICD-10-CM | POA: Diagnosis not present

## 2014-12-11 DIAGNOSIS — K219 Gastro-esophageal reflux disease without esophagitis: Secondary | ICD-10-CM | POA: Insufficient documentation

## 2014-12-11 DIAGNOSIS — S299XXA Unspecified injury of thorax, initial encounter: Secondary | ICD-10-CM | POA: Diagnosis present

## 2014-12-11 DIAGNOSIS — F0390 Unspecified dementia without behavioral disturbance: Secondary | ICD-10-CM | POA: Diagnosis not present

## 2014-12-11 DIAGNOSIS — F329 Major depressive disorder, single episode, unspecified: Secondary | ICD-10-CM | POA: Diagnosis not present

## 2014-12-11 DIAGNOSIS — Z79899 Other long term (current) drug therapy: Secondary | ICD-10-CM | POA: Insufficient documentation

## 2014-12-11 DIAGNOSIS — I10 Essential (primary) hypertension: Secondary | ICD-10-CM | POA: Diagnosis not present

## 2014-12-11 DIAGNOSIS — W08XXXA Fall from other furniture, initial encounter: Secondary | ICD-10-CM | POA: Diagnosis not present

## 2014-12-11 DIAGNOSIS — Z880 Allergy status to penicillin: Secondary | ICD-10-CM | POA: Insufficient documentation

## 2014-12-11 DIAGNOSIS — Y998 Other external cause status: Secondary | ICD-10-CM | POA: Diagnosis not present

## 2014-12-11 DIAGNOSIS — Y9289 Other specified places as the place of occurrence of the external cause: Secondary | ICD-10-CM | POA: Insufficient documentation

## 2014-12-11 DIAGNOSIS — Z7951 Long term (current) use of inhaled steroids: Secondary | ICD-10-CM | POA: Diagnosis not present

## 2014-12-11 DIAGNOSIS — W19XXXA Unspecified fall, initial encounter: Secondary | ICD-10-CM

## 2014-12-11 DIAGNOSIS — Y9389 Activity, other specified: Secondary | ICD-10-CM | POA: Insufficient documentation

## 2014-12-11 DIAGNOSIS — R0789 Other chest pain: Secondary | ICD-10-CM

## 2014-12-11 MED ORDER — HYDROCODONE-ACETAMINOPHEN 5-325 MG PO TABS: 1.0000 | ORAL_TABLET | Freq: Four times a day (QID) | ORAL | Status: DC | PRN

## 2014-12-11 MED ORDER — TRAMADOL HCL 50 MG PO TABS: 50.0000 mg | ORAL_TABLET | Freq: Four times a day (QID) | ORAL | Status: DC | PRN

## 2014-12-11 NOTE — ED Provider Notes (Addendum)
CSN: [id number]     Arrival date & time 12/11/14  1421 History   First MD Initiated Contact with Patient 12/11/14 1535     Chief Complaint  Patient presents with  . Fall     (Consider location/radiation/quality/duration/timing/severity/associated sxs/prior Treatment) Patient is a 81 y.o. female presenting with fall. The history is provided by the patient and a relative. The history is limited by the condition of the patient.  Fall   level V caveat applies to the history due to the patient's dementia. History provided by family members. Patient the spell yesterday onto a carpeted floor while getting up from a love seat. Fell on her left side. There was no loss of consciousness. Immediately there was no complaint or any difficulties with walking. Today patient with complaint of pain to the left lateral lower rib area. Patient still ambulating fine. No nausea vomiting.  Past Medical History  Diagnosis Date  . Hypertension   . Anxiety   . Depression   . Dementia   . GERD (gastroesophageal reflux disease)    Past Surgical History  Procedure Laterality Date  . Shoulder surgery     No family history on file. History  Substance Use Topics  . Smoking status: Never Smoker   . Smokeless tobacco: Not on file  . Alcohol Use: No   OB History    No data available     Review of Systems  Unable to perform ROS  level V caveat applies to the review of systems due to baseline dementia.    Allergies  Penicillins; Sulfa antibiotics; Aspirin; and Tylenol  Home Medications   Prior to Admission medications   Medication Sig Start Date End Date Taking? Authorizing Provider  citalopram (CELEXA) 10 MG tablet Take 10 mg by mouth daily.   Yes Historical Provider, MD  donepezil (ARICEPT) 5 MG tablet Take 5 mg by mouth at bedtime.   Yes Historical Provider, MD  enalapril (VASOTEC) 5 MG tablet Take 5 mg by mouth daily.   Yes Historical Provider, MD  fluticasone (VERAMYST) 27.5 MCG/SPRAY nasal  spray Place 2 sprays into the nose daily.   Yes Historical Provider, MD  loratadine (CLARITIN) 10 MG tablet Take 10 mg by mouth daily.   Yes Historical Provider, MD  LORazepam (ATIVAN) 0.5 MG tablet Take 0.5 mg by mouth every 8 (eight) hours.   Yes Historical Provider, MD  omeprazole (PRILOSEC) 20 MG capsule Take 20 mg by mouth daily.   Yes Historical Provider, MD  traMADol (ULTRAM) 50 MG tablet Take 1 tablet (50 mg total) by mouth every 6 (six) hours as needed. Patient not taking: Reported on 12/11/2014 07/13/13   Benjiman CoreNathan Pickering, MD  traMADol (ULTRAM) 50 MG tablet Take 1 tablet (50 mg total) by mouth every 6 (six) hours as needed. 12/11/14   Vanetta MuldersScott Kasiya Burck, MD   BP 159/73 mmHg  Pulse 90  Temp(Src) 97.9 F (36.6 C) (Oral)  Resp 16  Wt 110 lb (49.896 kg)  SpO2 96% Physical Exam  Constitutional: She appears well-developed and well-nourished. No distress.  HENT:  Head: Normocephalic and atraumatic.  Eyes: Conjunctivae and EOM are normal. Pupils are equal, round, and reactive to light.  Neck: Normal range of motion.  Cardiovascular: Normal rate and normal heart sounds.   No murmur heard. Pulmonary/Chest: Effort normal and breath sounds normal. No respiratory distress. She has no wheezes. She has no rales. She exhibits tenderness.  Tender to palpation to the left lower lateral ribs. No crepitance. No respiratory distress.  Abdominal: Soft. Bowel sounds are normal. There is no tenderness.  No tenderness to the left upper quadrant of the abdomen.  Musculoskeletal: Normal range of motion. She exhibits no tenderness.  Neurological: She is alert. No cranial nerve deficit. She exhibits normal muscle tone. Coordination normal.  Skin: Skin is warm. No rash noted.  Nursing note and vitals reviewed.   ED Course  Procedures (including critical care time) Labs Review Labs Reviewed - No data to display  Imaging Review Dg Ribs Unilateral W/chest Left  12/11/2014   CLINICAL DATA:  Rib pain.   Fall  EXAM: LEFT RIBS AND CHEST - 3+ VIEW  COMPARISON:  11/28/2012  FINDINGS: No fracture or other bone lesions are seen involving the ribs. There is no evidence of pneumothorax or pleural effusion. Both lungs are clear. Heart size and mediastinal contours are within normal limits. Apical scarring bilaterally.  IMPRESSION: Negative.   Electronically Signed   By: Marlan Palau M.D.   On: 12/11/2014 15:00     EKG Interpretation None      MDM   Final diagnoses:  Chest wall pain  Fall, initial encounter    Patient with fall yesterday while getting off of a love seat. Did fall on the left side. Patient is able to ambulate. Patient does have a certain degree of dementia as per family members. Patient pointing to her left lateral lower ribs. Workup of that area without any significant findings. No abdominal tenderness no nausea no vomiting no concerns furniture abdominal injury. Also no pain with range of motion of the left hip or left shoulder. No loss of consciousness as per family members. Patient did not have any complaints right after the fall but developed them today.  We'll treat symptomatically.   Vanetta Mulders, MD 12/11/14 1609  Addendum patient family felt that the tramadol would not be strong enough that she does better with Vicodin. She had that recently he has done fine. Does have a history of Tylenol allergy listed but apparently is not a true allergy. They state that she is able to take the Vicodin without any trouble.  Vanetta Mulders, MD 12/11/14 1616

## 2014-12-11 NOTE — ED Notes (Signed)
Daughter reports pt was reaching to get something off of the floor yesterday and fell from couch to the carpeted floor.  Reports pain in left side.

## 2014-12-11 NOTE — ED Notes (Signed)
Family at desk waiting to be signed out

## 2014-12-11 NOTE — Discharge Instructions (Signed)
X-rays of the left ribs and lungs without any significant abnormalities. As we discussed could be just bruised ribs or perhaps injury could be to the cartilage part of the ribs which is not visible on x-rays. Take the tramadol as needed for pain. Return for any new or worse symptoms. The rib area could hurt for several weeks. But would not expect her to develop a fever or get significant short of breath or have any new or worse symptoms.

## 2015-01-09 ENCOUNTER — Emergency Department (HOSPITAL_COMMUNITY): Payer: Medicare Other

## 2015-01-09 ENCOUNTER — Encounter (HOSPITAL_COMMUNITY): Payer: Self-pay

## 2015-01-09 ENCOUNTER — Emergency Department (HOSPITAL_COMMUNITY)
Admission: EM | Admit: 2015-01-09 | Discharge: 2015-01-09 | Disposition: A | Discharge status: Home / Self Care | Payer: Medicare Other | Attending: Emergency Medicine | Admitting: Emergency Medicine

## 2015-01-09 DIAGNOSIS — I10 Essential (primary) hypertension: Secondary | ICD-10-CM | POA: Insufficient documentation

## 2015-01-09 DIAGNOSIS — K219 Gastro-esophageal reflux disease without esophagitis: Secondary | ICD-10-CM | POA: Diagnosis not present

## 2015-01-09 DIAGNOSIS — Z880 Allergy status to penicillin: Secondary | ICD-10-CM | POA: Insufficient documentation

## 2015-01-09 DIAGNOSIS — F419 Anxiety disorder, unspecified: Secondary | ICD-10-CM | POA: Insufficient documentation

## 2015-01-09 DIAGNOSIS — R531 Weakness: Secondary | ICD-10-CM | POA: Insufficient documentation

## 2015-01-09 DIAGNOSIS — R509 Fever, unspecified: Secondary | ICD-10-CM | POA: Diagnosis not present

## 2015-01-09 DIAGNOSIS — F0390 Unspecified dementia without behavioral disturbance: Secondary | ICD-10-CM | POA: Insufficient documentation

## 2015-01-09 DIAGNOSIS — F329 Major depressive disorder, single episode, unspecified: Secondary | ICD-10-CM | POA: Diagnosis not present

## 2015-01-09 DIAGNOSIS — Z79899 Other long term (current) drug therapy: Secondary | ICD-10-CM | POA: Insufficient documentation

## 2015-01-09 DIAGNOSIS — Z7951 Long term (current) use of inhaled steroids: Secondary | ICD-10-CM | POA: Diagnosis not present

## 2015-01-09 LAB — URINALYSIS, ROUTINE W REFLEX MICROSCOPIC
Bilirubin Urine: NEGATIVE
Glucose, UA: NEGATIVE mg/dL
HGB URINE DIPSTICK: NEGATIVE
Leukocytes, UA: NEGATIVE
Nitrite: NEGATIVE
Protein, ur: NEGATIVE mg/dL
Specific Gravity, Urine: 1.025 (ref 1.005–1.030)
Urobilinogen, UA: 0.2 mg/dL (ref 0.0–1.0)
pH: 5.5 (ref 5.0–8.0)

## 2015-01-09 LAB — COMPREHENSIVE METABOLIC PANEL
ALBUMIN: 3.8 g/dL (ref 3.5–5.0)
ALK PHOS: 102 U/L (ref 38–126)
ALT: 15 U/L (ref 14–54)
ANION GAP: 9 (ref 5–15)
AST: 22 U/L (ref 15–41)
BUN: 16 mg/dL (ref 6–20)
CO2: 24 mmol/L (ref 22–32)
Calcium: 8.6 mg/dL — ABNORMAL LOW (ref 8.9–10.3)
Chloride: 105 mmol/L (ref 101–111)
Creatinine, Ser: 0.93 mg/dL (ref 0.44–1.00)
GFR calc non Af Amer: 53 mL/min — ABNORMAL LOW (ref >60)
Glucose, Bld: 108 mg/dL — ABNORMAL HIGH (ref 65–99)
Potassium: 4.2 mmol/L (ref 3.5–5.1)
Sodium: 138 mmol/L (ref 135–145)
Total Bilirubin: 0.5 mg/dL (ref 0.3–1.2)
Total Protein: 6.3 g/dL — ABNORMAL LOW (ref 6.5–8.1)

## 2015-01-09 LAB — CBC WITH DIFFERENTIAL/PLATELET
Basophils Absolute: 0 10*3/uL (ref 0.0–0.1)
Basophils Relative: 0 % (ref 0–1)
EOS PCT: 1 % (ref 0–5)
Eosinophils Absolute: 0.1 10*3/uL (ref 0.0–0.7)
HCT: 35.1 % — ABNORMAL LOW (ref 36.0–46.0)
Hemoglobin: 11.8 g/dL — ABNORMAL LOW (ref 12.0–15.0)
Lymphocytes Relative: 17 % (ref 12–46)
Lymphs Abs: 1.6 10*3/uL (ref 0.7–4.0)
MCH: 31.1 pg (ref 26.0–34.0)
MCHC: 33.6 g/dL (ref 30.0–36.0)
MCV: 92.4 fL (ref 78.0–100.0)
Monocytes Absolute: 0.5 10*3/uL (ref 0.1–1.0)
Monocytes Relative: 6 % (ref 3–12)
Neutro Abs: 6.9 10*3/uL (ref 1.7–7.7)
Neutrophils Relative %: 76 % (ref 43–77)
Platelets: 231 10*3/uL (ref 150–400)
RBC: 3.8 MIL/uL — ABNORMAL LOW (ref 3.87–5.11)
RDW: 12.9 % (ref 11.5–15.5)
WBC: 9.1 10*3/uL (ref 4.0–10.5)

## 2015-01-09 LAB — TROPONIN I: Troponin I: <0.03 ng/mL (ref <0.031)

## 2015-01-09 LAB — LACTIC ACID, PLASMA: LACTIC ACID, VENOUS: 1.4 mmol/L (ref 0.5–2.0)

## 2015-01-09 MED ORDER — IBUPROFEN 400 MG PO TABS: 600.0000 mg | ORAL_TABLET | Freq: Once | ORAL | Status: DC

## 2015-01-09 MED ORDER — SODIUM CHLORIDE 0.9 % IV BOLUS (SEPSIS)
500.0000 mL | Freq: Once | INTRAVENOUS | Status: AC
  Administered 2015-01-09: 500 mL via INTRAVENOUS

## 2015-01-09 NOTE — ED Notes (Signed)
Patient began feeling weak and dizziness today. Patient did not LOC. EMS reports initial left leg weakness which resolved in route.

## 2015-01-09 NOTE — ED Provider Notes (Signed)
TIME SEEN: 5:40 PM  CHIEF COMPLAINT: Generalized weakness  HPI: Pt is a 81 y.o. female with history of hypertension, depression and anxiety, dementia who presents to the emergency department with her family for concerns for generalized weakness. Granddaughter reports that the patient was walking to the car using her walker when her legs gave out from underneath her. She denies that the patient had any issues previous to this. This occurred around 4:30 PM. They deny that she fell or hit her head. She has not had any recent known fever, cough, vomiting or diarrhea.  ROS: Level V caveat for dementia  PAST MEDICAL HISTORY/PAST SURGICAL HISTORY:  Past Medical History  Diagnosis Date  . Hypertension   . Anxiety   . Depression   . Dementia   . GERD (gastroesophageal reflux disease)     MEDICATIONS:  Prior to Admission medications   Medication Sig Start Date End Date Taking? Authorizing Provider  citalopram (CELEXA) 10 MG tablet Take 10 mg by mouth daily.    Historical Provider, MD  donepezil (ARICEPT) 5 MG tablet Take 5 mg by mouth at bedtime.    Historical Provider, MD  enalapril (VASOTEC) 5 MG tablet Take 5 mg by mouth daily.    Historical Provider, MD  fluticasone (VERAMYST) 27.5 MCG/SPRAY nasal spray Place 2 sprays into the nose daily.    Historical Provider, MD  HYDROcodone-acetaminophen (NORCO/VICODIN) 5-325 MG per tablet Take 1-2 tablets by mouth every 6 (six) hours as needed. 12/11/14   Vanetta Mulders, MD  loratadine (CLARITIN) 10 MG tablet Take 10 mg by mouth daily.    Historical Provider, MD  LORazepam (ATIVAN) 0.5 MG tablet Take 0.5 mg by mouth every 8 (eight) hours.    Historical Provider, MD  omeprazole (PRILOSEC) 20 MG capsule Take 20 mg by mouth daily.    Historical Provider, MD  traMADol (ULTRAM) 50 MG tablet Take 1 tablet (50 mg total) by mouth every 6 (six) hours as needed. 12/11/14   Vanetta Mulders, MD    ALLERGIES:  Allergies  Allergen Reactions  . Penicillins   .  Sulfa Antibiotics   . Aspirin Nausea Only  . Tylenol [Acetaminophen]     Pt does not know why but states she cannot take tylenol    SOCIAL HISTORY:  History  Substance Use Topics  . Smoking status: Never Smoker   . Smokeless tobacco: Not on file  . Alcohol Use: No    FAMILY HISTORY: History reviewed. No pertinent family history.  EXAM: BP 115/97 mmHg  Pulse 59  Temp(Src) 100.3 F (37.9 C) (Oral)  Resp 16  SpO2 100% CONSTITUTIONAL: Alert and oriented to person only on the elderly, pleasant, smiling, in no distress, no complaints, unable to answer questions regarding today's event but can follow commands HEAD: Normocephalic EYES: Conjunctivae clear, PERRL ENT: normal nose; no rhinorrhea; moist mucous membranes; pharynx without lesions noted NECK: Supple, no meningismus, no LAD  CARD: RRR; S1 and S2 appreciated; no murmurs, no clicks, no rubs, no gallops RESP: Normal chest excursion without splinting or tachypnea; breath sounds clear and equal bilaterally; no wheezes, no rhonchi, no rales, no hypoxia or respiratory distress, speaking full sentences ABD/GI: Normal bowel sounds; non-distended; soft, non-tender, no rebound, no guarding, no peritoneal signs BACK:  The back appears normal and is non-tender to palpation, there is no CVA tenderness EXT: Normal ROM in all joints; non-tender to palpation; no edema; normal capillary refill; no cyanosis, no calf tenderness or swelling    SKIN: Normal color for  age and race; warm NEURO: Moves all extremities equally, sensation to light touch intact diffusely, cranial nerves II through XII intact PSYCH: The patient's mood and manner are appropriate. Grooming and personal hygiene are appropriate.  MEDICAL DECISION MAKING: Patient here with generalized weakness. Does have low-grade temperature of 100.3. We'll check rectal temperature, obtain cultures, chest x-ray, urine, labs. Will also obtain EKG, troponin. We'll give IV fluids. Will obtain a  head CT although she has no focal neurologic deficit on exam.  ED PROGRESS: Patient's labs are unremarkable. Troponin negative. Chest x-ray clear. Head CT shows no acute abnormality. Patient has been able to stand and ambulate with assistance. Family reports she does ambulate at home with a walker. Urine shows no sign of infection. Lactate is normal. I do not find any reason to admit the patient is a hospital at this time. Discussed with family that she may have an early viral illness that may be causing her low-grade fever and general weakness. Have encouraged them to encourage fluids and rest. Have advised him to follow-up with her PCP next week. Discussed return precautions. They verbalize understanding and are comfortable with this plan.      EKG Interpretation  Date/Time:  Friday Jan 09 2015 20:33:07 EDT Ventricular Rate:  62 PR Interval:  168 QRS Duration: 67 QT Interval:  451 QTC Calculation: 458 R Axis:   78 Text Interpretation:  Sinus rhythm Low voltage, precordial leads No significant change since last tracing Confirmed by Kenslee Achorn,  DO, Uriyah Raska [phone]([id number]) on 01/09/2015 8:42:54 PM        Layla MawKristen N Shawne Eskelson, DO 01/09/15 2148

## 2015-01-09 NOTE — Discharge Instructions (Signed)

## 2015-01-11 LAB — URINE CULTURE: Colony Count: 50000

## 2015-01-14 LAB — CULTURE, BLOOD (ROUTINE X 2)
Culture: NO GROWTH
Culture: NO GROWTH

## 2015-09-24 ENCOUNTER — Emergency Department (HOSPITAL_COMMUNITY)
Admission: EM | Admit: 2015-09-24 | Discharge: 2015-09-24 | Disposition: A | Discharge status: Home / Self Care | Payer: Medicare Other | Attending: Emergency Medicine | Admitting: Emergency Medicine

## 2015-09-24 ENCOUNTER — Emergency Department (HOSPITAL_COMMUNITY): Payer: Medicare Other

## 2015-09-24 ENCOUNTER — Encounter (HOSPITAL_COMMUNITY): Payer: Self-pay

## 2015-09-24 DIAGNOSIS — F419 Anxiety disorder, unspecified: Secondary | ICD-10-CM | POA: Insufficient documentation

## 2015-09-24 DIAGNOSIS — F329 Major depressive disorder, single episode, unspecified: Secondary | ICD-10-CM | POA: Diagnosis not present

## 2015-09-24 DIAGNOSIS — E860 Dehydration: Secondary | ICD-10-CM | POA: Insufficient documentation

## 2015-09-24 DIAGNOSIS — K219 Gastro-esophageal reflux disease without esophagitis: Secondary | ICD-10-CM | POA: Diagnosis not present

## 2015-09-24 DIAGNOSIS — I10 Essential (primary) hypertension: Secondary | ICD-10-CM | POA: Diagnosis not present

## 2015-09-24 DIAGNOSIS — Z7951 Long term (current) use of inhaled steroids: Secondary | ICD-10-CM | POA: Insufficient documentation

## 2015-09-24 DIAGNOSIS — F0390 Unspecified dementia without behavioral disturbance: Secondary | ICD-10-CM | POA: Insufficient documentation

## 2015-09-24 DIAGNOSIS — Z880 Allergy status to penicillin: Secondary | ICD-10-CM | POA: Diagnosis not present

## 2015-09-24 DIAGNOSIS — Z79899 Other long term (current) drug therapy: Secondary | ICD-10-CM | POA: Diagnosis not present

## 2015-09-24 DIAGNOSIS — R531 Weakness: Secondary | ICD-10-CM | POA: Diagnosis present

## 2015-09-24 LAB — URINALYSIS, ROUTINE W REFLEX MICROSCOPIC
Bilirubin Urine: NEGATIVE
Glucose, UA: NEGATIVE mg/dL
HGB URINE DIPSTICK: NEGATIVE
Ketones, ur: 15 mg/dL — AB
Leukocytes, UA: NEGATIVE
Nitrite: NEGATIVE
Protein, ur: 30 mg/dL — AB
pH: 6 (ref 5.0–8.0)

## 2015-09-24 LAB — COMPREHENSIVE METABOLIC PANEL
ALBUMIN: 4.5 g/dL (ref 3.5–5.0)
ALK PHOS: 81 U/L (ref 38–126)
ALT: 21 U/L (ref 14–54)
ANION GAP: 13 (ref 5–15)
AST: 45 U/L — AB (ref 15–41)
BILIRUBIN TOTAL: 0.6 mg/dL (ref 0.3–1.2)
BUN: 13 mg/dL (ref 6–20)
CALCIUM: 9.4 mg/dL (ref 8.9–10.3)
CO2: 25 mmol/L (ref 22–32)
Chloride: 104 mmol/L (ref 101–111)
Creatinine, Ser: 0.85 mg/dL (ref 0.44–1.00)
GFR calc Af Amer: >60 mL/min (ref >60)
GFR calc non Af Amer: 58 mL/min — ABNORMAL LOW (ref >60)
GLUCOSE: 114 mg/dL — AB (ref 65–99)
Potassium: 3.8 mmol/L (ref 3.5–5.1)
SODIUM: 142 mmol/L (ref 135–145)
TOTAL PROTEIN: 7.3 g/dL (ref 6.5–8.1)

## 2015-09-24 LAB — CBC WITH DIFFERENTIAL/PLATELET
BASOS ABS: 0 10*3/uL (ref 0.0–0.1)
BASOS PCT: 0 %
Eosinophils Absolute: 0 10*3/uL (ref 0.0–0.7)
Eosinophils Relative: 1 %
HCT: 41.2 % (ref 36.0–46.0)
HEMOGLOBIN: 13.8 g/dL (ref 12.0–15.0)
Lymphocytes Relative: 20 %
Lymphs Abs: 1.8 10*3/uL (ref 0.7–4.0)
MCH: 30.6 pg (ref 26.0–34.0)
MCHC: 33.5 g/dL (ref 30.0–36.0)
MCV: 91.4 fL (ref 78.0–100.0)
Monocytes Absolute: 1.1 10*3/uL — ABNORMAL HIGH (ref 0.1–1.0)
Monocytes Relative: 13 %
NEUTROS PCT: 66 %
Neutro Abs: 5.7 10*3/uL (ref 1.7–7.7)
Platelets: 316 10*3/uL (ref 150–400)
RBC: 4.51 MIL/uL (ref 3.87–5.11)
RDW: 13 % (ref 11.5–15.5)
WBC: 8.6 10*3/uL (ref 4.0–10.5)

## 2015-09-24 LAB — URINE MICROSCOPIC-ADD ON

## 2015-09-24 LAB — I-STAT TROPONIN, ED: Troponin i, poc: 0.07 ng/mL (ref 0.00–0.08)

## 2015-09-24 MED ORDER — SODIUM CHLORIDE 0.9 % IV BOLUS (SEPSIS)
1000.0000 mL | Freq: Once | INTRAVENOUS | Status: AC
  Administered 2015-09-24: 1000 mL via INTRAVENOUS

## 2015-09-24 NOTE — ED Notes (Signed)
EMS reports family has been checking pt's bp and it has been elevated for the past couple of days.  Reports called EMS yesterday and ems got a normal bp.  Today family says pt's bp was 180/110 and they called ems.  EMS checked bp manually and they got 150/90.  EMS also says that family said that something just isn't right with the pt but did not elaborate.

## 2015-09-24 NOTE — ED Provider Notes (Signed)
CSN: [id number]     Arrival date & time 09/24/15  1720 History   First MD Initiated Contact with Patient 09/24/15 1737     Chief Complaint  Patient presents with  . Hypertension     (Consider location/radiation/quality/duration/timing/severity/associated sxs/prior Treatment) Patient is a 82 y.o. female presenting with weakness. The history is provided by a relative (According to the family the patient seems be little bit more weak than usual not eating and drinking as well).  Weakness This is a recurrent problem. The current episode started more than 2 days ago. The problem occurs constantly. The problem has not changed since onset.Pertinent negatives include no chest pain and no abdominal pain. Nothing aggravates the symptoms. Nothing relieves the symptoms.    Past Medical History  Diagnosis Date  . Hypertension   . Anxiety   . Depression   . Dementia   . GERD (gastroesophageal reflux disease)    Past Surgical History  Procedure Laterality Date  . Shoulder surgery     No family history on file. Social History  Substance Use Topics  . Smoking status: Never Smoker   . Smokeless tobacco: None  . Alcohol Use: No   OB History    No data available     Review of Systems  Unable to perform ROS: Dementia  Cardiovascular: Negative for chest pain.  Gastrointestinal: Negative for abdominal pain.  Neurological: Positive for weakness.      Allergies  Penicillins; Sulfa antibiotics; Aspirin; and Tylenol  Home Medications   Prior to Admission medications   Medication Sig Start Date End Date Taking? Authorizing Provider  citalopram (CELEXA) 20 MG tablet Take 20 mg by mouth daily.   Yes Historical Provider, MD  donepezil (ARICEPT) 5 MG tablet Take 5 mg by mouth at bedtime.   Yes Historical Provider, MD  enalapril (VASOTEC) 5 MG tablet Take 5 mg by mouth daily.   Yes Historical Provider, MD  fluticasone (VERAMYST) 27.5 MCG/SPRAY nasal spray Place 2 sprays into the nose daily.    Yes Historical Provider, MD  haloperidol (HALDOL) 0.5 MG tablet Take 0.5 mg by mouth 3 (three) times daily.   Yes Historical Provider, MD  LORazepam (ATIVAN) 0.5 MG tablet Take 0.5 mg by mouth at bedtime.    Yes Historical Provider, MD  montelukast (SINGULAIR) 10 MG tablet Take 10 mg by mouth every morning.   Yes Historical Provider, MD  omeprazole (PRILOSEC) 20 MG capsule Take 20 mg by mouth every evening.    Yes Historical Provider, MD  loratadine (CLARITIN) 5 MG/5ML syrup Take 5 mg by mouth daily.    Historical Provider, MD   BP 144/72 mmHg  Pulse 94  Temp(Src) 97.6 F (36.4 C) (Oral)  Resp 16  SpO2 98% Physical Exam  Constitutional: She appears well-developed.  HENT:  Head: Normocephalic.  Mildly dry mucous membranes  Eyes: Conjunctivae and EOM are normal. No scleral icterus.  Neck: Neck supple. No thyromegaly present.  Cardiovascular: Normal rate and regular rhythm.  Exam reveals no gallop and no friction rub.   No murmur heard. Pulmonary/Chest: No stridor. She has no wheezes. She has no rales. She exhibits no tenderness.  Abdominal: She exhibits no distension. There is no tenderness. There is no rebound.  Musculoskeletal: Normal range of motion. She exhibits no edema.  Lymphadenopathy:    She has no cervical adenopathy.  Neurological: She is alert. She exhibits normal muscle tone. Coordination normal.  Patient has severe dementia and is only oriented to person  Skin: No  rash noted. No erythema.    ED Course  Procedures (including critical care time) Labs Review Labs Reviewed  CBC WITH DIFFERENTIAL/PLATELET - Abnormal; Notable for the following:    Monocytes Absolute 1.1 (*)    All other components within normal limits  COMPREHENSIVE METABOLIC PANEL - Abnormal; Notable for the following:    Glucose, Bld 114 (*)    AST 45 (*)    GFR calc non Af Amer 58 (*)    All other components within normal limits  URINALYSIS, ROUTINE W REFLEX MICROSCOPIC (NOT AT [facility]) - Abnormal;  Notable for the following:    Specific Gravity, Urine >1.030 (*)    Ketones, ur 15 (*)    Protein, ur 30 (*)    All other components within normal limits  URINE MICROSCOPIC-ADD ON - Abnormal; Notable for the following:    Squamous Epithelial / LPF 0-5 (*)    Bacteria, UA FEW (*)    All other components within normal limits  I-STAT TROPOININ, ED    Imaging Review Dg Chest 2 View  09/24/2015  CLINICAL DATA:  Weakness.  Hypertension. EXAM: CHEST  2 VIEW COMPARISON:  01/09/2015 FINDINGS: The patient is rotated to the right on today's radiograph, reducing diagnostic sensitivity and specificity. Atherosclerotic calcification of the aortic arch. Mild enlargement of the cardiopericardial silhouette, without edema. Bony demineralization. Thoracic spondylosis. I do not observe a thoracic spine compression fracture. No pleural effusion. IMPRESSION: 1. Mild enlargement of the cardiopericardial silhouette, without edema or pleural effusion. 2. Bony demineralization. Electronically Signed   By: Gaylyn Rong M.D.   On: 09/24/2015 18:56   Ct Head Wo Contrast  09/24/2015  CLINICAL DATA:  Elevated blood pressure up to 180/110. Weakness and shaking. EXAM: CT HEAD WITHOUT CONTRAST TECHNIQUE: Contiguous axial images were obtained from the base of the skull through the vertex without intravenous contrast. COMPARISON:  01/09/2015 FINDINGS: Despite efforts by the technologist and patient, motion artifact is present on today's exam and could not be eliminated. This reduces exam sensitivity and specificity. The brainstem, cerebellum, cerebral peduncles, thalami, basal ganglia, basilar cisterns, and ventricular system appear within normal limits. Periventricular white matter and corona radiata hypodensities favor chronic ischemic microvascular white matter disease. No intracranial hemorrhage, mass lesion, or acute CVA. There is atherosclerotic calcification of the cavernous carotid arteries bilaterally. Postoperative  findings along the right maxilla. IMPRESSION: 1. No acute intracranial findings. 2. Periventricular white matter and corona radiata hypodensities favor chronic ischemic microvascular white matter disease. 3. Reduced sensitivity due to the degree of motion artifact despite re- scan attempts. Electronically Signed   By: Gaylyn Rong M.D.   On: 09/24/2015 18:47   I have personally reviewed and evaluated these images and lab results as part of my medical decision-making.   EKG Interpretation   Date/Time:  Thursday September 24 2015 18:02:25 EST Ventricular Rate:  94 PR Interval:  130 QRS Duration: 82 QT Interval:  376 QTC Calculation: 470 R Axis:   88 Text Interpretation:  Sinus rhythm Borderline right axis deviation  Confirmed by Lelah Rennaker  MD, Dharma Pare ([id number]) on 09/24/2015 7:16:35 PM      MDM   Final diagnoses:  Dehydration    Mild dehydration and severe dementia. Labs and CT scan of the head unremarkable. Patient given 1 L of fluids. She is to follow-up with her PCP next week    Bethann Berkshire, MD 09/24/15 2121

## 2015-09-24 NOTE — Discharge Instructions (Signed)
Follow-up with her family doctor next week continued to push fluids

## 2016-07-15 DEATH — deceased

## 2016-12-08 IMAGING — CT CT HEAD W/O CM
1 of 2 series · 13 of 30 positions shown, 17 images · non-contrast
Comparison: 01/09/2015

CLINICAL DATA: Elevated blood pressure up to 180/110. Weakness and
shaking.

EXAM:
CT HEAD WITHOUT CONTRAST
TECHNIQUE: Contiguous axial images were obtained from the base of the skull
through the vertex without intravenous contrast.

[Series 2: headseq 4.8 h37s · axial · 0.43mm/px · z∈[+111,+261]mm · 13 of 36 slices shown, 17 images]
[im 3/36  brain]
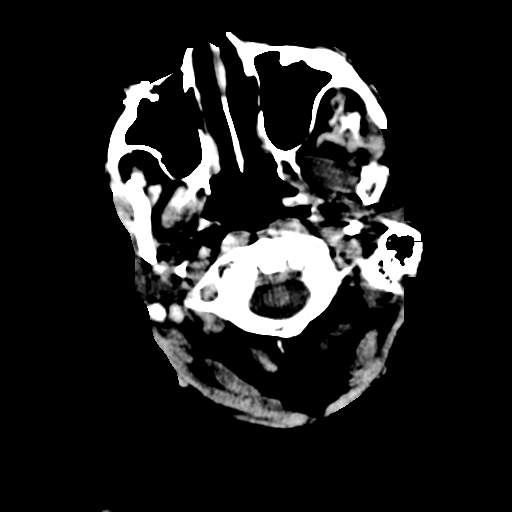
[im 3/36  bone]
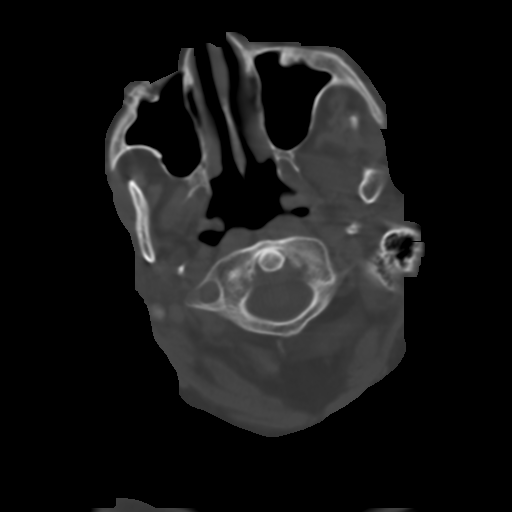
[im 6/36  brain]
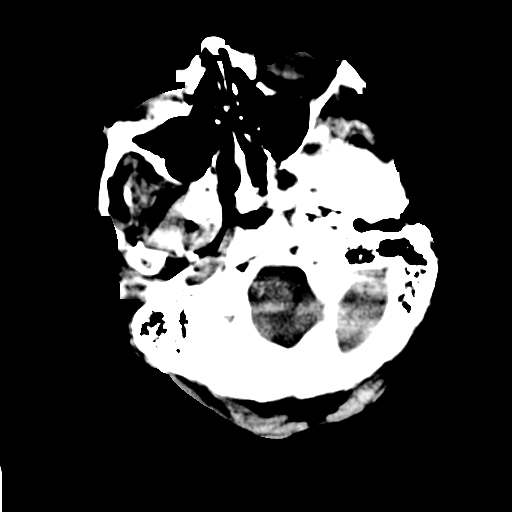
[im 8/36  brain]
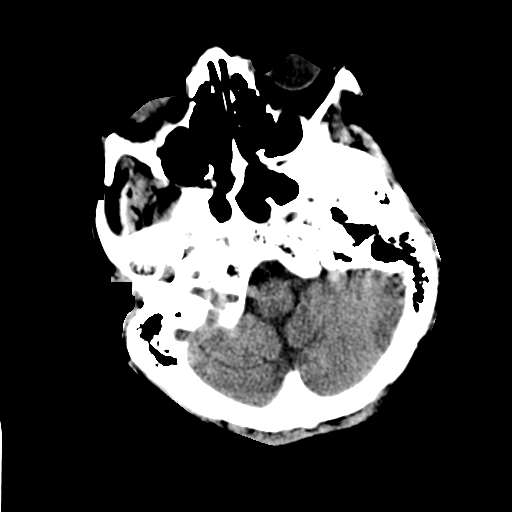
[im 11/36  brain]
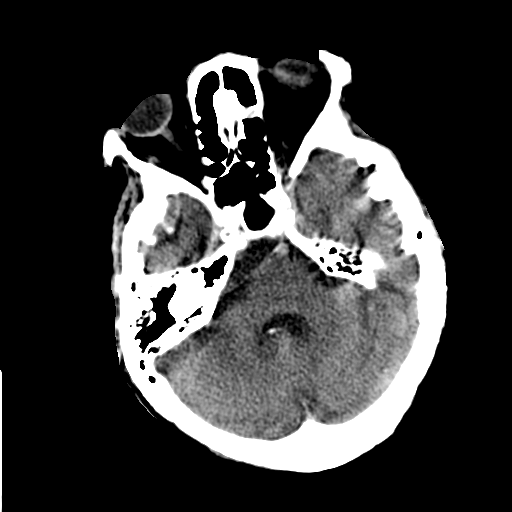
[im 13/36  brain]
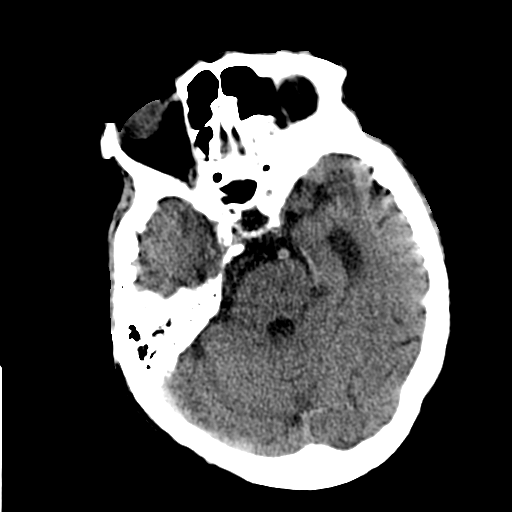
[im 13/36  bone]
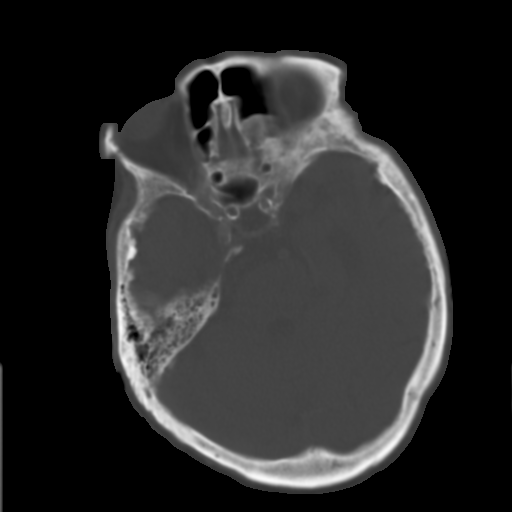
[im 16/36  brain]
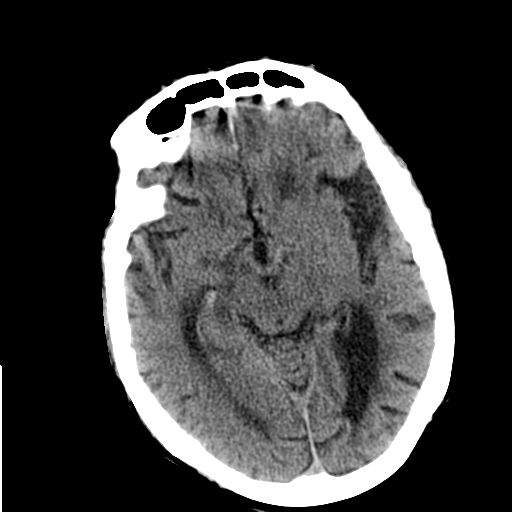
[im 18/36  brain]
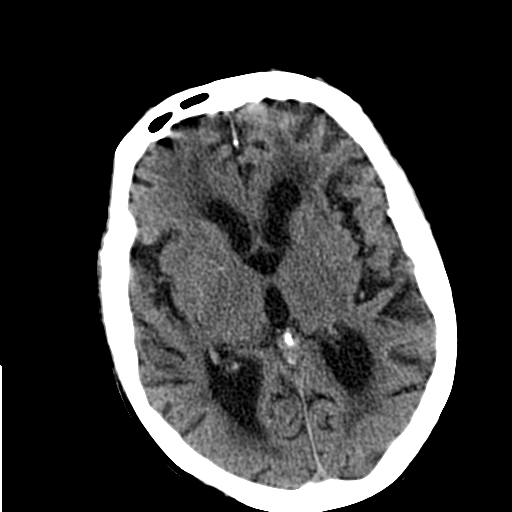
[im 21/36  brain]
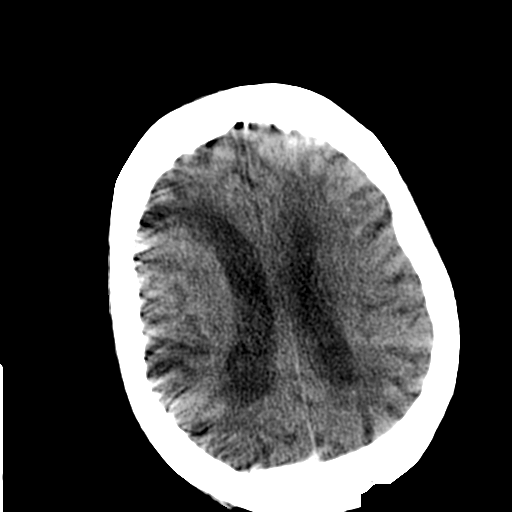
[im 23/36  brain]
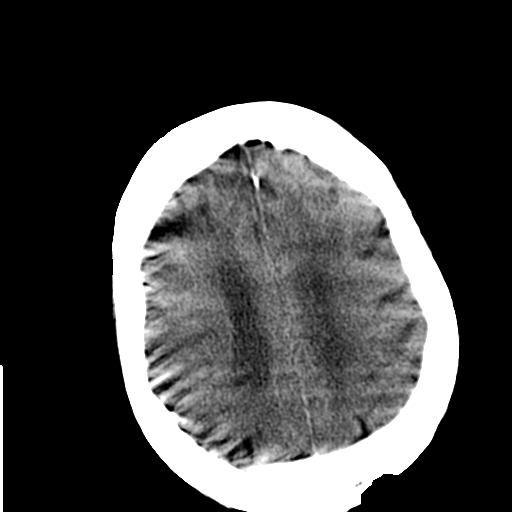
[im 23/36  bone]
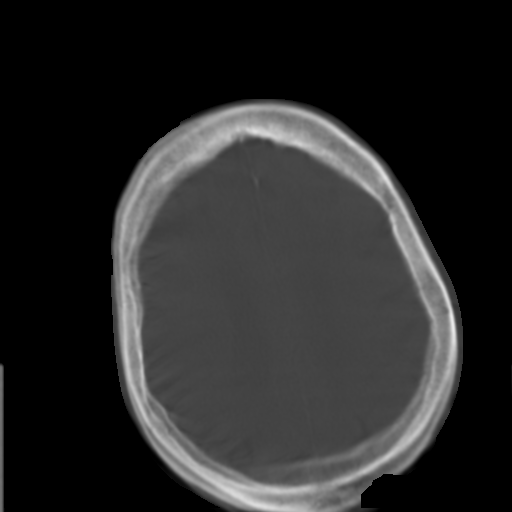
[im 26/36  brain]
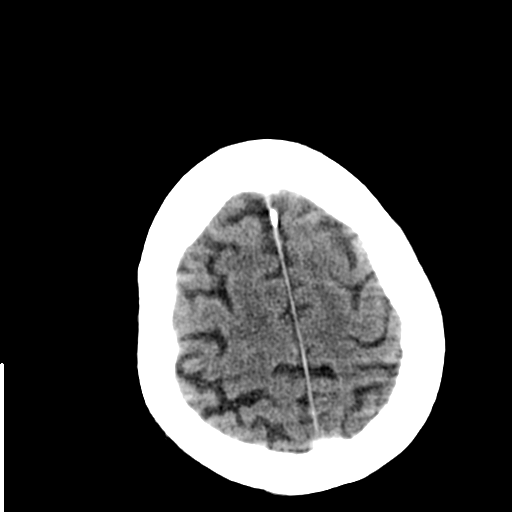
[im 28/36  brain]
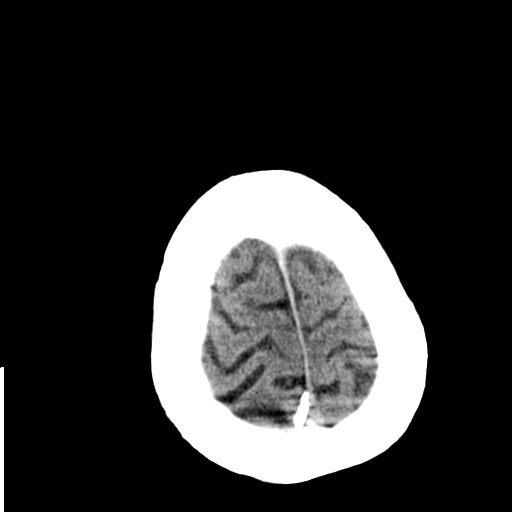
[im 31/36  brain]
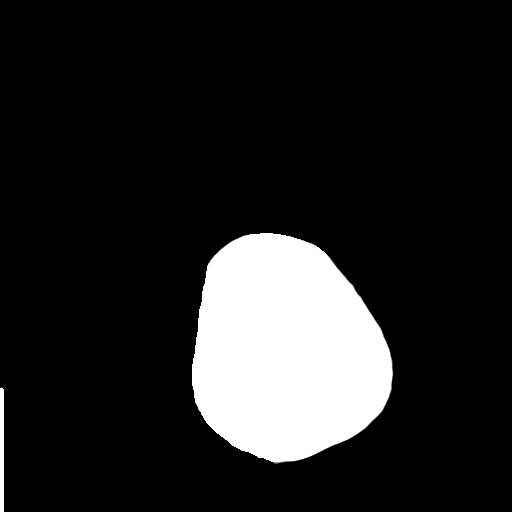
[im 33/36  brain]
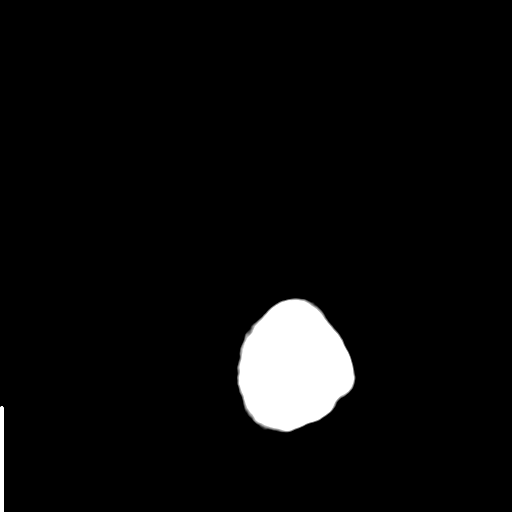
[im 33/36  bone]
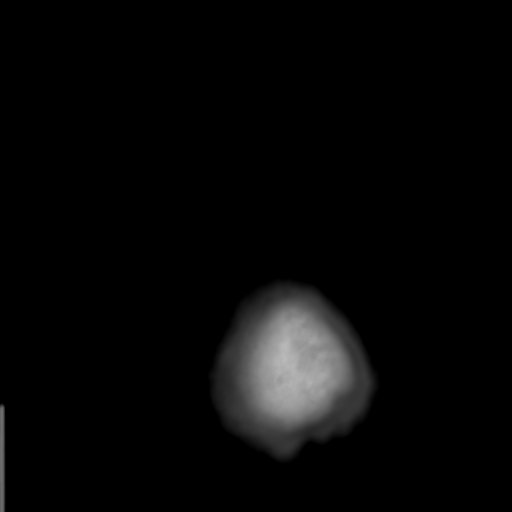

[13 of 30 positions shown; findings below may reference images not displayed]

FINDINGS: Despite efforts by the technologist and patient, motion artifact is
present on today's exam and could not be eliminated. This reduces
exam sensitivity and specificity.

The brainstem, cerebellum, cerebral peduncles, thalami, basal
ganglia, basilar cisterns, and ventricular system appear within
normal limits. Periventricular white matter and corona radiata
hypodensities favor chronic ischemic microvascular white matter
disease. No intracranial hemorrhage, mass lesion, or acute CVA.

There is atherosclerotic calcification of the cavernous carotid
arteries bilaterally. Postoperative findings along the right
maxilla.
IMPRESSION: 1. No acute intracranial findings.
2. Periventricular white matter and corona radiata hypodensities
favor chronic ischemic microvascular white matter disease.
3. Reduced sensitivity due to the degree of motion artifact despite
re- scan attempts.

## 2016-12-08 IMAGING — DX DG CHEST 2V
3 series · 3 of 3 positions shown · non-contrast
Comparison: 01/09/2015

CLINICAL DATA: Weakness.  Hypertension.

EXAM:
CHEST  2 VIEW

[chest lat (1 of 2)]
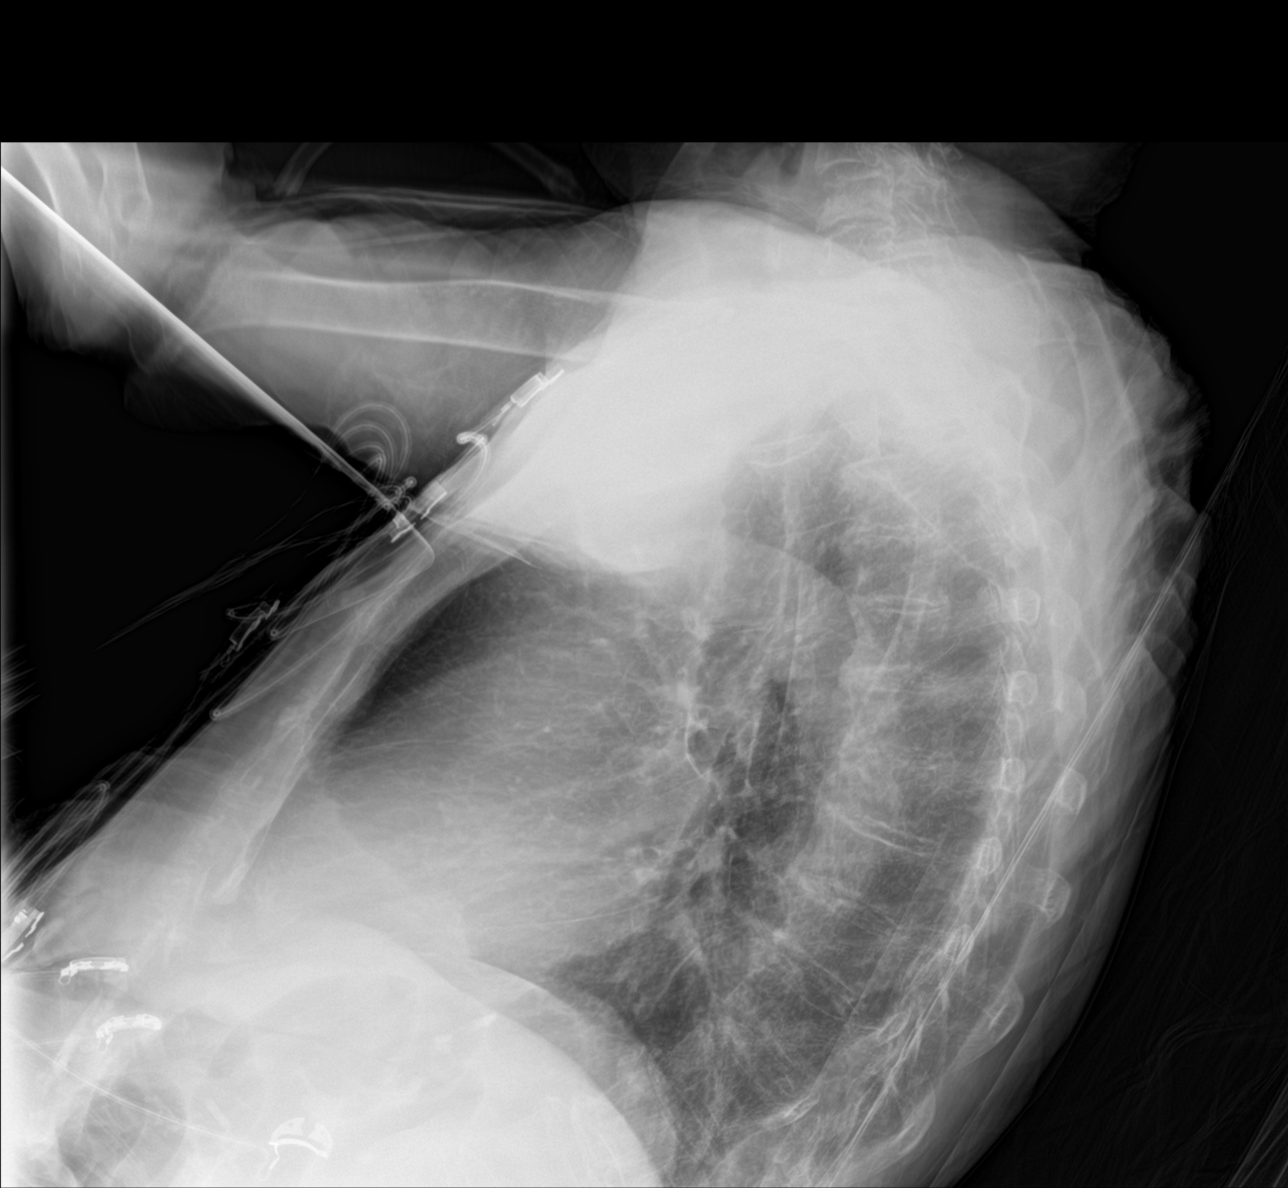

[chest ap]
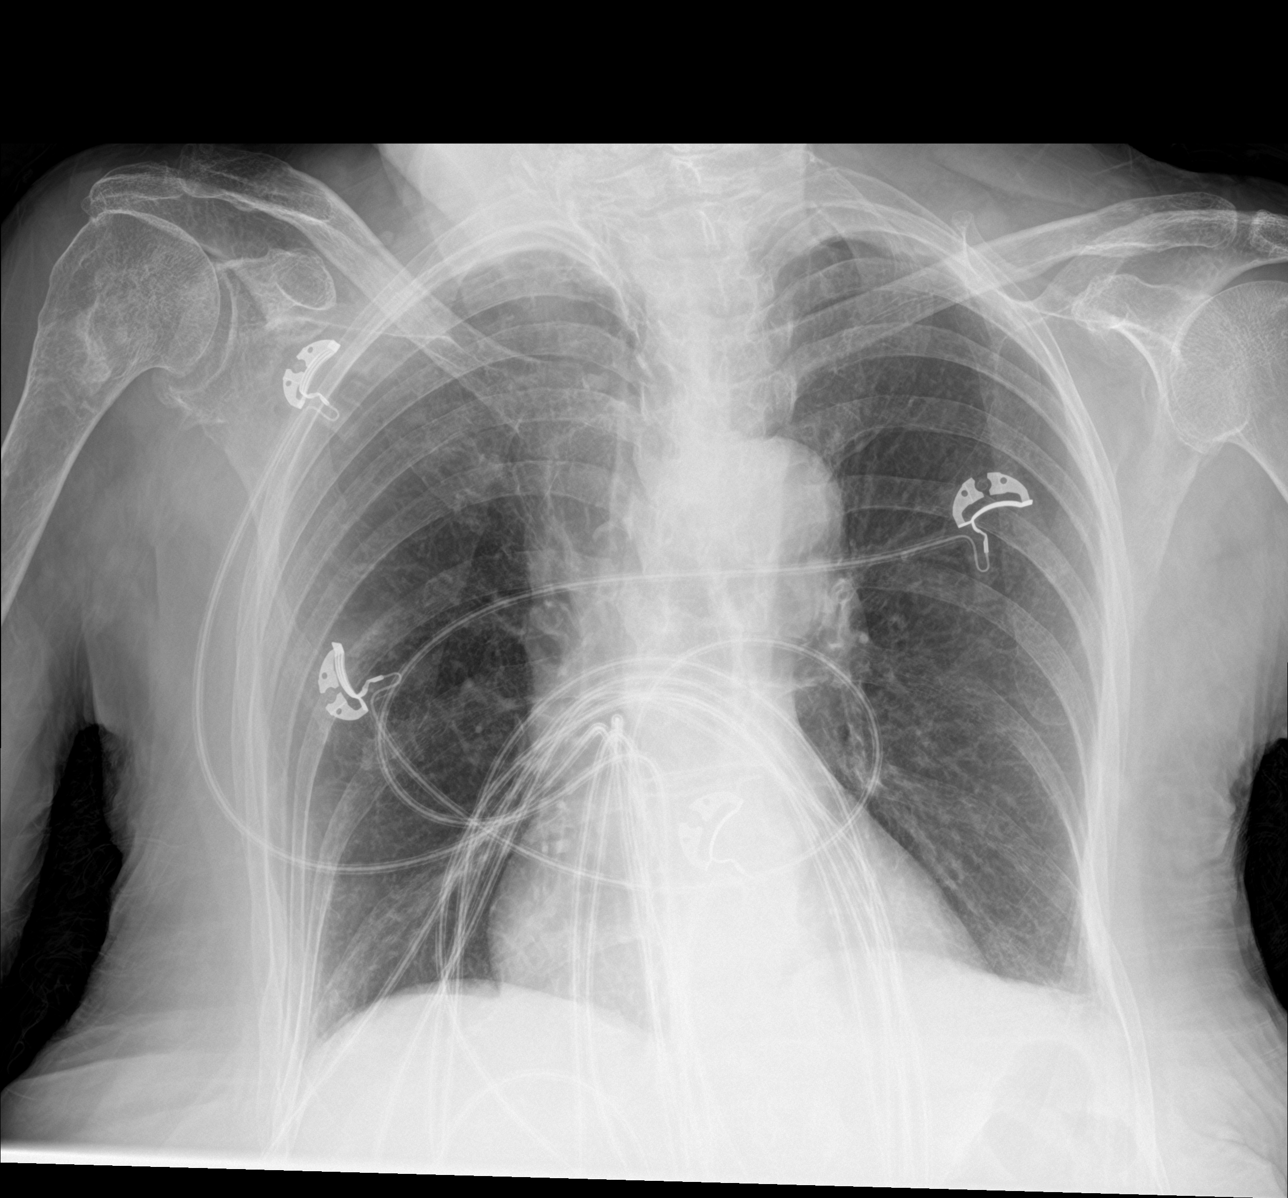

[chest lat (2 of 2)]
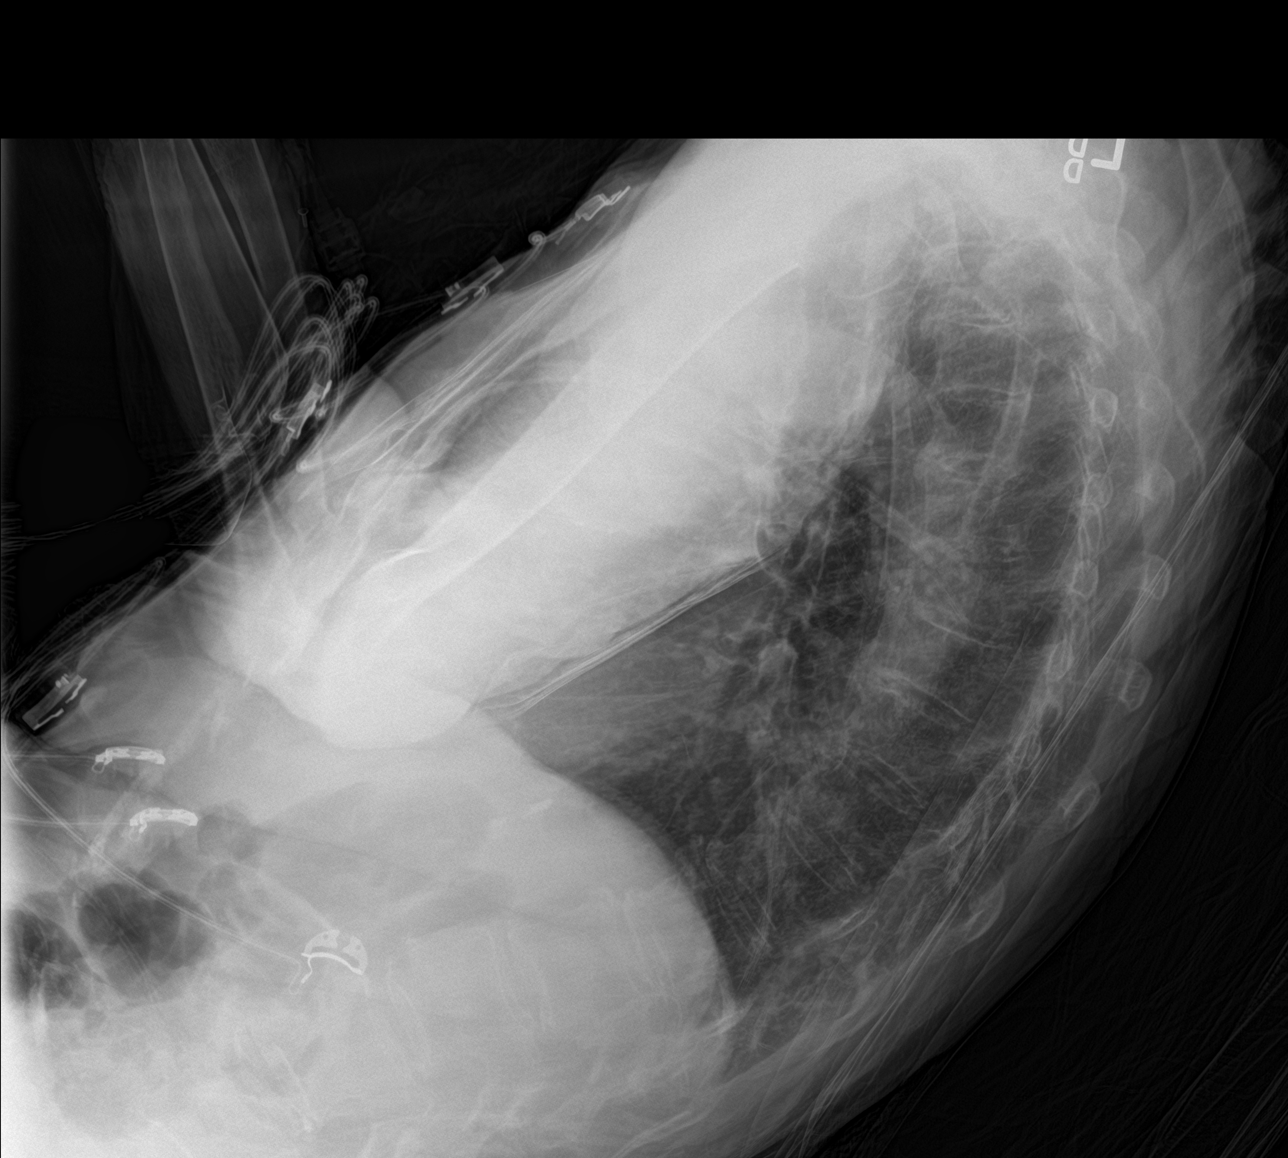

[3 of 3 positions shown; findings below may reference images not displayed]

FINDINGS: The patient is rotated to the right on today's radiograph, reducing
diagnostic sensitivity and specificity. Atherosclerotic
calcification of the aortic arch. Mild enlargement of the
cardiopericardial silhouette, without edema. Bony demineralization.
Thoracic spondylosis. I do not observe a thoracic spine compression
fracture.

No pleural effusion.
IMPRESSION: 1. Mild enlargement of the cardiopericardial silhouette, without
edema or pleural effusion.
2. Bony demineralization.
